# Patient Record
Sex: Female | Born: 1980
Health system: Southern US, Community
[De-identification: ages and names within clinical notes are randomized; demographics above are authoritative.]

## PROBLEM LIST (undated history)

## (undated) DIAGNOSIS — K529 Noninfective gastroenteritis and colitis, unspecified: Secondary | ICD-10-CM

## (undated) HISTORY — DX: Noninfective gastroenteritis and colitis, unspecified: K52.9

---

## 2014-08-29 ENCOUNTER — Emergency Department (HOSPITAL_COMMUNITY)
Admission: EM | Admit: 2014-08-29 | Discharge: 2014-08-29 | Disposition: A | Discharge status: Home / Self Care | Payer: Self-pay | Attending: Emergency Medicine | Admitting: Emergency Medicine

## 2014-08-29 ENCOUNTER — Encounter (HOSPITAL_COMMUNITY): Payer: Self-pay | Admitting: Family Medicine

## 2014-08-29 DIAGNOSIS — Z720 Tobacco use: Secondary | ICD-10-CM | POA: Insufficient documentation

## 2014-08-29 DIAGNOSIS — Y92410 Unspecified street and highway as the place of occurrence of the external cause: Secondary | ICD-10-CM | POA: Insufficient documentation

## 2014-08-29 DIAGNOSIS — S3992XA Unspecified injury of lower back, initial encounter: Secondary | ICD-10-CM | POA: Insufficient documentation

## 2014-08-29 DIAGNOSIS — Y9389 Activity, other specified: Secondary | ICD-10-CM | POA: Insufficient documentation

## 2014-08-29 DIAGNOSIS — S0990XA Unspecified injury of head, initial encounter: Secondary | ICD-10-CM | POA: Insufficient documentation

## 2014-08-29 DIAGNOSIS — R51 Headache: Secondary | ICD-10-CM

## 2014-08-29 DIAGNOSIS — R519 Headache, unspecified: Secondary | ICD-10-CM

## 2014-08-29 DIAGNOSIS — Y998 Other external cause status: Secondary | ICD-10-CM | POA: Insufficient documentation

## 2014-08-29 MED ORDER — DIAZEPAM 5 MG PO TABS
5.0000 mg | ORAL_TABLET | Freq: Once | ORAL | Status: AC
  Administered 2014-08-29: 5 mg via ORAL
  Filled 2014-08-29: qty 1

## 2014-08-29 MED ORDER — KETOROLAC TROMETHAMINE 60 MG/2ML IM SOLN
60.0000 mg | Freq: Once | INTRAMUSCULAR | Status: AC
  Administered 2014-08-29: 60 mg via INTRAMUSCULAR
  Filled 2014-08-29: qty 2

## 2014-08-29 MED ORDER — OXYCODONE-ACETAMINOPHEN 5-325 MG PO TABS
1.0000 | ORAL_TABLET | Freq: Once | ORAL | Status: AC
  Administered 2014-08-29: 1 via ORAL
  Filled 2014-08-29: qty 1

## 2014-08-29 NOTE — ED Notes (Signed)
Declined W/C at D/C and was escorted to lobby by RN. 
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
## 2014-08-29 NOTE — Discharge Instructions (Signed)
Return to the emergency room with worsening of symptoms, new symptoms or with symptoms that are concerning, especially severe worsening of headache, visual or speech changes, weakness in face, arms or legs. RICE: Rest, Ice (three cycles of 20 mins on, 20mins off at least twice a day), compression/brace, elevation. Heating pad works well for back pain. Ibuprofen 400mg  (2 tablets 200mg ) every 5-6 hours for 3-5 days and then as needed for pain. Follow up with PCP/wellness center if symptoms worsen or are persistent.

## 2014-12-30 ENCOUNTER — Encounter (HOSPITAL_COMMUNITY): Payer: Self-pay | Admitting: Emergency Medicine

## 2014-12-30 ENCOUNTER — Emergency Department (HOSPITAL_COMMUNITY): Payer: Self-pay

## 2014-12-30 ENCOUNTER — Emergency Department (HOSPITAL_COMMUNITY)
Admission: EM | Admit: 2014-12-30 | Discharge: 2014-12-30 | Disposition: A | Discharge status: Home / Self Care | Payer: Self-pay | Attending: Emergency Medicine | Admitting: Emergency Medicine

## 2014-12-30 ENCOUNTER — Encounter (HOSPITAL_COMMUNITY): Payer: Self-pay | Admitting: *Deleted

## 2014-12-30 ENCOUNTER — Emergency Department (INDEPENDENT_AMBULATORY_CARE_PROVIDER_SITE_OTHER)
Admission: EM | Admit: 2014-12-30 | Discharge: 2014-12-30 | Disposition: A | Discharge status: Nursing Home (Includes ICF) | Payer: Self-pay | Source: Home / Self Care

## 2014-12-30 DIAGNOSIS — Z720 Tobacco use: Secondary | ICD-10-CM | POA: Insufficient documentation

## 2014-12-30 DIAGNOSIS — R10813 Right lower quadrant abdominal tenderness: Secondary | ICD-10-CM

## 2014-12-30 DIAGNOSIS — R1011 Right upper quadrant pain: Secondary | ICD-10-CM

## 2014-12-30 DIAGNOSIS — R3915 Urgency of urination: Secondary | ICD-10-CM | POA: Insufficient documentation

## 2014-12-30 DIAGNOSIS — R10811 Right upper quadrant abdominal tenderness: Secondary | ICD-10-CM

## 2014-12-30 DIAGNOSIS — K529 Noninfective gastroenteritis and colitis, unspecified: Secondary | ICD-10-CM | POA: Insufficient documentation

## 2014-12-30 DIAGNOSIS — R112 Nausea with vomiting, unspecified: Secondary | ICD-10-CM

## 2014-12-30 DIAGNOSIS — Z3202 Encounter for pregnancy test, result negative: Secondary | ICD-10-CM | POA: Insufficient documentation

## 2014-12-30 LAB — COMPREHENSIVE METABOLIC PANEL
ALT: 26 U/L (ref 0–35)
AST: 27 U/L (ref 0–37)
Albumin: 3.9 g/dL (ref 3.5–5.2)
Alkaline Phosphatase: 38 U/L — ABNORMAL LOW (ref 39–117)
Anion gap: 11 (ref 5–15)
BILIRUBIN TOTAL: 0.5 mg/dL (ref 0.3–1.2)
BUN: 8 mg/dL (ref 6–23)
CHLORIDE: 103 mmol/L (ref 96–112)
CO2: 24 mmol/L (ref 19–32)
Calcium: 8.9 mg/dL (ref 8.4–10.5)
Creatinine, Ser: 0.79 mg/dL (ref 0.50–1.10)
GFR calc non Af Amer: >90 mL/min (ref >90)
Glucose, Bld: 89 mg/dL (ref 70–99)
Potassium: 4 mmol/L (ref 3.5–5.1)
SODIUM: 138 mmol/L (ref 135–145)
Total Protein: 7.1 g/dL (ref 6.0–8.3)

## 2014-12-30 LAB — CBC WITH DIFFERENTIAL/PLATELET
Basophils Absolute: 0 10*3/uL (ref 0.0–0.1)
Basophils Relative: 0 % (ref 0–1)
EOS ABS: 0.1 10*3/uL (ref 0.0–0.7)
Eosinophils Relative: 1 % (ref 0–5)
HCT: 35.9 % — ABNORMAL LOW (ref 36.0–46.0)
Hemoglobin: 11.9 g/dL — ABNORMAL LOW (ref 12.0–15.0)
LYMPHS PCT: 29 % (ref 12–46)
Lymphs Abs: 2.2 10*3/uL (ref 0.7–4.0)
MCH: 28.1 pg (ref 26.0–34.0)
MCHC: 33.1 g/dL (ref 30.0–36.0)
MCV: 84.9 fL (ref 78.0–100.0)
Monocytes Absolute: 0.7 10*3/uL (ref 0.1–1.0)
Monocytes Relative: 10 % (ref 3–12)
Neutro Abs: 4.6 10*3/uL (ref 1.7–7.7)
Neutrophils Relative %: 60 % (ref 43–77)
PLATELETS: 271 10*3/uL (ref 150–400)
RBC: 4.23 MIL/uL (ref 3.87–5.11)
RDW: 13.4 % (ref 11.5–15.5)
WBC: 7.6 10*3/uL (ref 4.0–10.5)

## 2014-12-30 LAB — URINALYSIS, ROUTINE W REFLEX MICROSCOPIC
Bilirubin Urine: NEGATIVE
GLUCOSE, UA: NEGATIVE mg/dL
HGB URINE DIPSTICK: NEGATIVE
Ketones, ur: NEGATIVE mg/dL
Leukocytes, UA: NEGATIVE
Nitrite: NEGATIVE
PH: 7.5 (ref 5.0–8.0)
Protein, ur: NEGATIVE mg/dL
SPECIFIC GRAVITY, URINE: 1.01 (ref 1.005–1.030)
UROBILINOGEN UA: 1 mg/dL (ref 0.0–1.0)

## 2014-12-30 LAB — LIPASE, BLOOD: Lipase: 16 U/L (ref 11–59)

## 2014-12-30 LAB — I-STAT TROPONIN, ED: TROPONIN I, POC: 0 ng/mL (ref 0.00–0.08)

## 2014-12-30 LAB — POCT PREGNANCY, URINE: Preg Test, Ur: NEGATIVE

## 2014-12-30 MED ORDER — GI COCKTAIL ~~LOC~~
30.0000 mL | Freq: Once | ORAL | Status: AC
  Administered 2014-12-30: 30 mL via ORAL
  Filled 2014-12-30: qty 30

## 2014-12-30 MED ORDER — FENTANYL CITRATE (PF) 100 MCG/2ML IJ SOLN
50.0000 ug | Freq: Once | INTRAMUSCULAR | Status: AC
  Administered 2014-12-30: 50 ug via INTRAVENOUS
  Filled 2014-12-30: qty 2

## 2014-12-30 MED ORDER — PANTOPRAZOLE SODIUM 40 MG IV SOLR
40.0000 mg | Freq: Once | INTRAVENOUS | Status: AC
  Administered 2014-12-30: 40 mg via INTRAVENOUS
  Filled 2014-12-30: qty 40

## 2014-12-30 MED ORDER — METRONIDAZOLE 500 MG PO TABS: 500.0000 mg | ORAL_TABLET | Freq: Three times a day (TID) | ORAL | Status: DC

## 2014-12-30 MED ORDER — IOHEXOL 300 MG/ML  SOLN
100.0000 mL | Freq: Once | INTRAMUSCULAR | Status: AC | PRN
  Administered 2014-12-30: 100 mL via INTRAVENOUS

## 2014-12-30 MED ORDER — CIPROFLOXACIN HCL 500 MG PO TABS
500.0000 mg | ORAL_TABLET | Freq: Two times a day (BID) | ORAL | Status: DC
Start: 2014-12-30 — End: 2015-01-19

## 2014-12-30 MED ORDER — ONDANSETRON 4 MG PO TBDP
ORAL_TABLET | ORAL | Status: AC
  Filled 2014-12-30: qty 2

## 2014-12-30 MED ORDER — ONDANSETRON HCL 8 MG PO TABS: 8.0000 mg | ORAL_TABLET | Freq: Three times a day (TID) | ORAL | Status: DC | PRN

## 2014-12-30 MED ORDER — IOHEXOL 300 MG/ML  SOLN
25.0000 mL | Freq: Once | INTRAMUSCULAR | Status: AC | PRN
  Administered 2014-12-30: 25 mL via ORAL

## 2014-12-30 MED ORDER — MORPHINE SULFATE 4 MG/ML IJ SOLN
4.0000 mg | Freq: Once | INTRAMUSCULAR | Status: AC
  Administered 2014-12-30: 4 mg via INTRAVENOUS
  Filled 2014-12-30: qty 1

## 2014-12-30 MED ORDER — SODIUM CHLORIDE 0.9 % IV BOLUS (SEPSIS)
1000.0000 mL | Freq: Once | INTRAVENOUS | Status: AC
  Administered 2014-12-30: 1000 mL via INTRAVENOUS

## 2014-12-30 MED ORDER — ONDANSETRON 4 MG PO TBDP
8.0000 mg | ORAL_TABLET | Freq: Once | ORAL | Status: AC
  Administered 2014-12-30: 8 mg via ORAL

## 2014-12-30 MED ORDER — HYDROCODONE-ACETAMINOPHEN 5-325 MG PO TABS: 1.0000 | ORAL_TABLET | Freq: Four times a day (QID) | ORAL | Status: DC | PRN

## 2014-12-30 NOTE — ED Provider Notes (Signed)
CSN: [id number]     Arrival date & time 12/30/14  1652 History   First MD Initiated Contact with Patient 12/30/14 1725     Chief Complaint  Patient presents with  . Abdominal Pain     (Consider location/radiation/quality/duration/timing/severity/associated sxs/prior Treatment) HPI Comments: Cathy Lester is a 34 y.o. female who presents to the ED sent here from [facility] with complaints of 3 months of intermittent right upper quadrant pain. She reports the pain is sharp, 10/10, intermittent, radiating into the right lower quadrant, worse with movement, unrelated to eating, and with no known alleviating factors given that she has not tried anything. Associated symptoms include nausea and vomiting, reporting that she has had 2 episodes of nonbloody nonbilious emesis in the last 24 hours consisting of stomach contents. Additionally she states she has had urinary urgency, but denies any dysuria or urinary frequency. She denies any fevers, chills, chest pain, shortness breath, diarrhea, constipation, obstipation, melena, hematochezia, hematemesis, dysuria, hematuria, urinary frequency, vaginal bleeding or discharge, numbness, tingling, weakness, rashes, arthralgias, myalgias, sick contacts, recent travel, antibiotic use, suspicious food intake, or alcohol use. She endorses to taking NSAIDs daily for chronic headaches. Last menstrual period was 3 days ago. She is sexually active with females only, no penetrative sexual activities.   Patient is a 34 y.o. female presenting with abdominal pain. The history is provided by the patient. No language interpreter was used.  Abdominal Pain Pain location:  RUQ Pain quality: sharp   Pain radiates to:  RLQ Pain severity:  Severe Onset quality:  Gradual Duration:  12 weeks Timing:  Intermittent Progression:  Worsening Chronicity:  New Context: not alcohol use, not eating, not recent illness, not recent sexual activity, not recent travel, not sick contacts and not  suspicious food intake   Relieved by:  None tried Worsened by:  Movement Ineffective treatments:  None tried Associated symptoms: nausea and vomiting   Associated symptoms: no chest pain, no chills, no constipation, no diarrhea, no dysuria, no fever, no flatus, no hematemesis, no hematochezia, no hematuria, no melena, no shortness of breath, no vaginal bleeding and no vaginal discharge   Risk factors: NSAID use   Risk factors: no alcohol abuse     History reviewed. No pertinent past medical history. History reviewed. No pertinent past surgical history. No family history on file. History  Substance Use Topics  . Smoking status: Current Every Day Smoker -- 0.50 packs/day    Types: Cigarettes  . Smokeless tobacco: Not on file  . Alcohol Use: No   OB History    No data available     Review of Systems  Constitutional: Negative for fever and chills.  Respiratory: Negative for shortness of breath.   Cardiovascular: Negative for chest pain.  Gastrointestinal: Positive for nausea, vomiting and abdominal pain. Negative for diarrhea, constipation, blood in stool, melena, hematochezia, flatus and hematemesis.  Genitourinary: Positive for urgency. Negative for dysuria, frequency, hematuria, flank pain, vaginal bleeding and vaginal discharge.  Musculoskeletal: Negative for myalgias and arthralgias.  Skin: Negative for rash.  Allergic/Immunologic: Negative for immunocompromised state.  Neurological: Negative for weakness and numbness.  Psychiatric/Behavioral: Negative for confusion.   10 Systems reviewed and are negative for acute change except as noted in the HPI.    Allergies  Review of patient's allergies indicates no known allergies.  Home Medications   Prior to Admission medications   Medication Sig Start Date End Date Taking? Authorizing Provider  acetaminophen (TYLENOL) 325 MG tablet Take 650 mg by mouth every  6 (six) hours as needed.   Yes Historical Provider, MD   BP 113/71  mmHg  Pulse 60  Temp(Src) 97.5 F (36.4 C) (Oral)  Resp 18  Ht  (1.702 m)  Wt 180 lb (81.647 kg)  BMI 28.19 kg/m2  SpO2 100%  LMP 12/22/2014 Physical Exam  Constitutional: She is oriented to person, place, and time. Vital signs are normal. She appears well-developed and well-nourished.  Non-toxic appearance. No distress.  Afebrile, nontoxic, NAD  HENT:  Head: Normocephalic and atraumatic.  Mouth/Throat: Oropharynx is clear and moist and mucous membranes are normal.  Eyes: Conjunctivae and EOM are normal. Right eye exhibits no discharge. Left eye exhibits no discharge.  Neck: Normal range of motion. Neck supple.  Cardiovascular: Normal rate, regular rhythm, normal heart sounds and intact distal pulses.  Exam reveals no gallop and no friction rub.   No murmur heard. Pulmonary/Chest: Effort normal and breath sounds normal. No respiratory distress. She has no decreased breath sounds. She has no wheezes. She has no rhonchi. She has no rales.  Abdominal: Soft. Normal appearance and bowel sounds are normal. She exhibits no distension. There is tenderness in the right upper quadrant and right lower quadrant. There is guarding (voluntary), tenderness at McBurney's point and positive Murphy's sign. There is no rigidity, no rebound and no CVA tenderness.    Soft, nondistended, +BS throughout, with RUQ TTP and RLQ TTP over Mcburney's point, slight guarding voluntarily, no rebound or rigidity, +murphy's, neg psoas sign, neg foot tap test, no CVA TTP   Musculoskeletal: Normal range of motion.  Neurological: She is alert and oriented to person, place, and time. She has normal strength. No sensory deficit.  Skin: Skin is warm, dry and intact. No rash noted.  Psychiatric: She has a normal mood and affect.  Nursing note and vitals reviewed.   ED Course  Procedures (including critical care time) Labs Review Labs Reviewed  CBC WITH DIFFERENTIAL/PLATELET - Abnormal; Notable for the following:     Hemoglobin 11.9 (*)    HCT 35.9 (*)    All other components within normal limits  COMPREHENSIVE METABOLIC PANEL - Abnormal; Notable for the following:    Alkaline Phosphatase 38 (*)    All other components within normal limits  LIPASE, BLOOD  URINALYSIS, ROUTINE W REFLEX MICROSCOPIC  I-STAT TROPOININ, ED  POC URINE PREG, ED    Imaging Review Ct Abdomen Pelvis W Contrast  12/30/2014   CLINICAL DATA:  Right lower and right upper quadrant tenderness for 2 weeks. Positive Murphy sign. Question appendicitis versus cholecystitis.  EXAM: CT ABDOMEN AND PELVIS WITH CONTRAST  TECHNIQUE: Multidetector CT imaging of the abdomen and pelvis was performed using the standard protocol following bolus administration of intravenous contrast.  CONTRAST:  OMNIPAQUE IOHEXOL 300 MG/ML  SOLN  COMPARISON:  None.  FINDINGS: The included lung bases are clear.  The gallbladder is physiologically distended without pericholecystic inflammatory change.  There is a tiny appendicolith in the mid appendix, however remains normal in thickness measuring 6 mm. There is no periappendiceal inflammatory change to suggest appendicitis.  There is colonic wall thickening involving the cecum and proximal ascending colon with faint surrounding pericolonic soft tissue stranding. The terminal ileum appears normal. The remainder the colon appears normal. No small bowel dilatation.  The liver, spleen, adrenal glands, pancreas, and kidneys are normal. Abdominal aorta is normal in caliber. No retroperitoneal adenopathy. No free air, free fluid, or intra-abdominal fluid collection.  Within the pelvis the urinary bladder is  physiologically distended. The uterus and adnexa are normal for age. Trace pelvic free fluid is physiologic. Minimal fat in both inguinal canals.  There are no acute or suspicious osseous abnormalities. Subcortical cystic change noted about both hips. There is a bone island in the left pubic ramus.  IMPRESSION: 1. Colonic wall  thickening with mild surrounding inflammatory change involving the cecum and ascending colon most consistent with colitis. This may be infectious or inflammatory. The terminal ileum appears normal. 2. Normal gallbladder. Tiny appendicolith in the mid appendix, however no appendicitis. No periappendiceal inflammatory change.   Electronically Signed   By: Rubye OaksMelanie  Ehinger M.D.   On: 12/30/2014 19:37     EKG Interpretation   Date/Time:  Thursday December 30 2014 17:51:10 EDT Ventricular Rate:  60 PR Interval:  127 QRS Duration: 80 QT Interval:  437 QTC Calculation: 437 R Axis:   73 Text Interpretation:  Sinus rhythm Confirmed by HARRISON  MD, FORREST  ([id number]) on 12/30/2014 5:56:25 PM      MDM   Final diagnoses:  RLQ abdominal tenderness  RUQ abdominal tenderness  Non-intractable vomiting with nausea, vomiting of unspecified type  Colitis    34 y.o. female sent here from [facility] for RUQ and RLQ pain, on exam pt is tender in RUQ with +murphy's but also has RLQ tenderness over mcburney's point. Will proceed with CT imaging (to r/o appendicitis and hopefully visualize gallbladder) and labs, will give antiemetics and pain meds. Doubt need for pelvic exam, pt is not engaging in penetrative sex with men (only has sexual encounters with females, no penetration). Will reassess shortly.   7:27 PM Pt requesting more pain meds, didn't like how morphine made her feel, will try fentanyl now. Will also give GI cocktail and protonix. So far, troponin neg, CBC w/diff unremarkable, CMP WNL, lipase WNL. EKG unremarkable. Awaiting CT reading and urinalysis. Tolerating PO here.   8:39 PM CT showing colonic wall thickening with mild surrounding inflammatory changes involving cecum and ascending colon c/w colitis, no appendicitis or cholecystitis. Still awaiting urinalysis. Pain returning, will give another dose of fentanyl.  10:05 PM Pain improved with fentanyl. Tolerating PO with no ongoing nausea. U/A clear. She  now endorses that over the last few months she's had nonbloody loose stool when she eats, approx 1x/day. Given this, along with the colitis on CT, will proceed with treatment of infectious sources, but will have her f/up with GI in order to have further testing to see if it could be inflammatory (crohn's could fit the clinical picture). Will send home with pain meds and nausea meds. Discussed staying hydrated. I explained the diagnosis and have given explicit precautions to return to the ER including for any other new or worsening symptoms. The patient understands and accepts the medical plan as it's been dictated and I have answered their questions. Discharge instructions concerning home care and prescriptions have been given. The patient is STABLE and is discharged to home in good condition.  BP 109/64 mmHg  Pulse 58  Temp(Src) 97.5 F (36.4 C) (Oral)  Resp 17  Ht 5\' 7"  (1.702 m)  Wt 180 lb (81.647 kg)  BMI 28.19 kg/m2  SpO2 100%  LMP 12/22/2014  Meds ordered this encounter  Medications  . ondansetron (ZOFRAN-ODT) disintegrating tablet 8 mg    Sig:   . sodium chloride 0.9 % bolus 1,000 mL    Sig:   . morphine 4 MG/ML injection 4 mg    Sig:   . iohexol (OMNIPAQUE) 300  MG/ML solution 25 mL    Sig:   . iohexol (OMNIPAQUE) 300 MG/ML solution 100 mL    Sig:   . fentaNYL (SUBLIMAZE) injection 50 mcg    Sig:   . gi cocktail (Maalox,Lidocaine,Donnatal)    Sig:   . pantoprazole (PROTONIX) injection 40 mg    Sig:   . fentaNYL (SUBLIMAZE) injection 50 mcg    Sig:   . HYDROcodone-acetaminophen (NORCO) 5-325 MG per tablet    Sig: Take 1 tablet by mouth every 6 (six) hours as needed for severe pain.    Dispense:  10 tablet    Refill:  0    Order Specific Question:  Supervising Provider    Answer:  Hyacinth Meeker, BRIAN [[id number]]  . ondansetron (ZOFRAN) 8 MG tablet    Sig: Take 1 tablet (8 mg total) by mouth every 8 (eight) hours as needed for nausea or vomiting.    Dispense:  10 tablet    Refill:   0    Order Specific Question:  Supervising Provider    Answer:  Hyacinth Meeker, BRIAN [[id number]]  . ciprofloxacin (CIPRO) 500 MG tablet    Sig: Take 1 tablet (500 mg total) by mouth 2 (two) times daily. One po bid x 7 days    Dispense:  14 tablet    Refill:  0    Order Specific Question:  Supervising Provider    Answer:  Hyacinth Meeker, BRIAN [[id number]]  . metroNIDAZOLE (FLAGYL) 500 MG tablet    Sig: Take 1 tablet (500 mg total) by mouth 3 (three) times daily. One po tid x 7 days    Dispense:  21 tablet    Refill:  0    Order Specific Question:  Supervising Provider    Answer:  Eber Hong [[id number]]     Coen Miyasato Camprubi-Soms, PA-C 12/30/14 2212  Purvis Sheffield, MD 12/31/14 [phone]

## 2014-12-30 NOTE — Discharge Instructions (Signed)
Your lab work was unremarkable but your CT scan showed colitis (inflammation of the colon). This could be infectious in nature therefore take cipro and flagyl as directed, and don't drink alcohol while taking these. stay well hydrated and use norco as needed for pain. Avoid NSAIDs (ibuprofen/aleve) when possible but if you need to take them then always have a full stomach when you use them. Use zofran as needed for nausea/vomiting. Stay well hydrated. Follow up with the GI doctor for further evaluation and treatment. Return to the ER for changes or worsening symptoms.   Abdominal (belly) pain can be caused by many things. Your caregiver performed an examination and possibly ordered blood/urine tests and imaging (CT scan, x-rays, ultrasound). Many cases can be observed and treated at home after initial evaluation in the emergency department. Even though you are being discharged home, abdominal pain can be unpredictable. Therefore, you need a repeated exam if your pain does not resolve, returns, or worsens. Most patients with abdominal pain don't have to be admitted to the hospital or have surgery, but serious problems like appendicitis and gallbladder attacks can start out as nonspecific pain. Many abdominal conditions cannot be diagnosed in one visit, so follow-up evaluations are very important. SEEK IMMEDIATE MEDICAL ATTENTION IF YOU DEVELOP ANY OF THE FOLLOWING SYMPTOMS:  The pain does not go away or becomes severe.   A temperature above 101 develops.   Repeated vomiting occurs (multiple episodes).   The pain becomes localized to portions of the abdomen. The right side could possibly be appendicitis. In an adult, the left lower portion of the abdomen could be colitis or diverticulitis.   Blood is being passed in stools or vomit (bright red or black tarry stools).   Return also if you develop chest pain, difficulty breathing, dizziness or fainting, or become confused, poorly responsive, or  inconsolable (young children).  The constipation stays for more than 4 days.   There is belly (abdominal) or rectal pain.   You do not seem to be getting better.     Abdominal Pain, Women Abdominal (stomach, pelvic, or belly) pain can be caused by many things. It is important to tell your doctor:  The location of the pain.  Does it come and go or is it present all the time?  Are there things that start the pain (eating certain foods, exercise)?  Are there other symptoms associated with the pain (fever, nausea, vomiting, diarrhea)? All of this is helpful to know when trying to find the cause of the pain. CAUSES   Stomach: virus or bacteria infection, or ulcer.  Intestine: appendicitis (inflamed appendix), regional ileitis (Crohn's disease), ulcerative colitis (inflamed colon), irritable bowel syndrome, diverticulitis (inflamed diverticulum of the colon), or cancer of the stomach or intestine.  Gallbladder disease or stones in the gallbladder.  Kidney disease, kidney stones, or infection.  Pancreas infection or cancer.  Fibromyalgia (pain disorder).  Diseases of the female organs:  Uterus: fibroid (non-cancerous) tumors or infection.  Fallopian tubes: infection or tubal pregnancy.  Ovary: cysts or tumors.  Pelvic adhesions (scar tissue).  Endometriosis (uterus lining tissue growing in the pelvis and on the pelvic organs).  Pelvic congestion syndrome (female organs filling up with blood just before the menstrual period).  Pain with the menstrual period.  Pain with ovulation (producing an egg).  Pain with an IUD (intrauterine device, birth control) in the uterus.  Cancer of the female organs.  Functional pain (pain not caused by a disease, may improve without treatment).  Psychological pain.  Depression. DIAGNOSIS  Your doctor will decide the seriousness of your pain by doing an examination.  Blood tests.  X-rays.  Ultrasound.  CT scan (computed  tomography, special type of X-ray).  MRI (magnetic resonance imaging).  Cultures, for infection.  Barium enema (dye inserted in the large intestine, to better view it with X-rays).  Colonoscopy (looking in intestine with a lighted tube).  Laparoscopy (minor surgery, looking in abdomen with a lighted tube).  Major abdominal exploratory surgery (looking in abdomen with a large incision). TREATMENT  The treatment will depend on the cause of the pain.   Many cases can be observed and treated at home.  Over-the-counter medicines recommended by your caregiver.  Prescription medicine.  Antibiotics, for infection.  Birth control pills, for painful periods or for ovulation pain.  Hormone treatment, for endometriosis.  Nerve blocking injections.  Physical therapy.  Antidepressants.  Counseling with a psychologist or psychiatrist.  Minor or major surgery. HOME CARE INSTRUCTIONS   Do not take laxatives, unless directed by your caregiver.  Take over-the-counter pain medicine only if ordered by your caregiver. Do not take aspirin because it can cause an upset stomach or bleeding.  Try a clear liquid diet (broth or water) as ordered by your caregiver. Slowly move to a bland diet, as tolerated, if the pain is related to the stomach or intestine.  Have a thermometer and take your temperature several times a day, and record it.  Bed rest and sleep, if it helps the pain.  Avoid sexual intercourse, if it causes pain.  Avoid stressful situations.  Keep your follow-up appointments and tests, as your caregiver orders.  If the pain does not go away with medicine or surgery, you may try:  Acupuncture.  Relaxation exercises (yoga, meditation).  Group therapy.  Counseling. SEEK MEDICAL CARE IF:   You notice certain foods cause stomach pain.  Your home care treatment is not helping your pain.  You need stronger pain medicine.  You want your IUD removed.  You feel faint  or lightheaded.  You develop nausea and vomiting.  You develop a rash.  You are having side effects or an allergy to your medicine. SEEK IMMEDIATE MEDICAL CARE IF:   Your pain does not go away or gets worse.  You have a fever.  Your pain is felt only in portions of the abdomen. The right side could possibly be appendicitis. The left lower portion of the abdomen could be colitis or diverticulitis.  You are passing blood in your stools (bright red or black tarry stools, with or without vomiting).  You have blood in your urine.  You develop chills, with or without a fever.  You pass out. MAKE SURE YOU:   Understand these instructions.  Will watch your condition.  Will get help right away if you are not doing well or get worse. Document Released: 07/01/2007 Document Revised: 01/18/2014 Document Reviewed: 07/21/2009 [facility] Patient Information 2015 [place], [place]. This information is not intended to replace advice given to you by your health care provider. Make sure you discuss any questions you have with your health care provider.  Nausea and Vomiting Nausea is a sick feeling that often comes before throwing up (vomiting). Vomiting is a reflex where stomach contents come out of your mouth. Vomiting can cause severe loss of body fluids (dehydration). Children and elderly adults can become dehydrated quickly, especially if they also have diarrhea. Nausea and vomiting are symptoms of a condition or disease. It is  important to find the cause of your symptoms. CAUSES   Direct irritation of the stomach lining. This irritation can result from increased acid production (gastroesophageal reflux disease), infection, food poisoning, taking certain medicines (such as nonsteroidal anti-inflammatory drugs), alcohol use, or tobacco use.  Signals from the brain.These signals could be caused by a headache, heat exposure, an inner ear disturbance, increased pressure in the brain from injury,  infection, a tumor, or a concussion, pain, emotional stimulus, or metabolic problems.  An obstruction in the gastrointestinal tract (bowel obstruction).  Illnesses such as diabetes, hepatitis, gallbladder problems, appendicitis, kidney problems, cancer, sepsis, atypical symptoms of a heart attack, or eating disorders.  Medical treatments such as chemotherapy and radiation.  Receiving medicine that makes you sleep (general anesthetic) during surgery. DIAGNOSIS Your caregiver may ask for tests to be done if the problems do not improve after a few days. Tests may also be done if symptoms are severe or if the reason for the nausea and vomiting is not clear. Tests may include:  Urine tests.  Blood tests.  Stool tests.  Cultures (to look for evidence of infection).  X-rays or other imaging studies. Test results can help your caregiver make decisions about treatment or the need for additional tests. TREATMENT You need to stay well hydrated. Drink frequently but in small amounts.You may wish to drink water, sports drinks, clear broth, or eat frozen ice pops or gelatin dessert to help stay hydrated.When you eat, eating slowly may help prevent nausea.There are also some antinausea medicines that may help prevent nausea. HOME CARE INSTRUCTIONS   Take all medicine as directed by your caregiver.  If you do not have an appetite, do not force yourself to eat. However, you must continue to drink fluids.  If you have an appetite, eat a normal diet unless your caregiver tells you differently.  Eat a variety of complex carbohydrates (rice, wheat, potatoes, bread), lean meats, yogurt, fruits, and vegetables.  Avoid high-fat foods because they are more difficult to digest.  Drink enough water and fluids to keep your urine clear or pale yellow.  If you are dehydrated, ask your caregiver for specific rehydration instructions. Signs of dehydration may include:  Severe thirst.  Dry lips and  mouth.  Dizziness.  Dark urine.  Decreasing urine frequency and amount.  Confusion.  Rapid breathing or pulse. SEEK IMMEDIATE MEDICAL CARE IF:   You have blood or brown flecks (like coffee grounds) in your vomit.  You have black or bloody stools.  You have a severe headache or stiff neck.  You are confused.  You have severe abdominal pain.  You have chest pain or trouble breathing.  You do not urinate at least once every 8 hours.  You develop cold or clammy skin.  You continue to vomit for longer than 24 to 48 hours.  You have a fever. MAKE SURE YOU:   Understand these instructions.  Will watch your condition.  Will get help right away if you are not doing well or get worse. Document Released: 09/03/2005 Document Revised: 11/26/2011 Document Reviewed: 01/31/2011 [facility] Patient Information 2015 [place], [place]. This information is not intended to replace advice given to you by your health care provider. Make sure you discuss any questions you have with your health care provider.  Colitis Colitis is inflammation of the colon. Colitis can be a short-term or long-standing (chronic) illness. Crohn's disease and ulcerative colitis are 2 types of colitis which are chronic. They usually require lifelong treatment. CAUSES  There are many different causes of colitis, including:  Viruses.  Germs (bacteria).  Medicine reactions. SYMPTOMS   Diarrhea.  Intestinal bleeding.  Pain.  Fever.  Throwing up (vomiting).  Tiredness (fatigue).  Weight loss.  Bowel blockage. DIAGNOSIS  The diagnosis of colitis is based on examination and stool or blood tests. X-rays, CT scan, and colonoscopy may also be needed. TREATMENT  Treatment may include:  Fluids given through the vein (intravenously).  Bowel rest (nothing to eat or drink for a period of time).  Medicine for pain and diarrhea.  Medicines (antibiotics) that kill germs.  Cortisone  medicines.  Surgery. HOME CARE INSTRUCTIONS   Get plenty of rest.  Drink enough water and fluids to keep your urine clear or pale yellow.  Eat a well-balanced diet.  Call your caregiver for follow-up as recommended. SEEK IMMEDIATE MEDICAL CARE IF:   You develop chills.  You have an oral temperature above 102 F (38.9 C), not controlled by medicine.  You have extreme weakness, fainting, or dehydration.  You have repeated vomiting.  You develop severe belly (abdominal) pain or are passing bloody or tarry stools. MAKE SURE YOU:   Understand these instructions.  Will watch your condition.  Will get help right away if you are not doing well or get worse. Document Released: 10/11/2004 Document Revised: 11/26/2011 Document Reviewed: 01/06/2010 [facility] Patient Information 2015 [place], [place]. This information is not intended to replace advice given to you by your health care provider. Make sure you discuss any questions you have with your health care provider.

## 2014-12-30 NOTE — ED Provider Notes (Signed)
Cathy Lester is a 34 y.o. female who presents to Urgent Care today for abdominal pain. Patient has a two-week history of right-sided abdominal pain. This worsened yesterday evening and is now severe. The pain does not seem to be related to food. She denies any fevers or chills but does note a mild headache and sore throat. She does note some urinary urgency but denies any frequency or dysuria. No vomiting. No treatment tried yet.   History reviewed. No pertinent past medical history. History reviewed. No pertinent past surgical history. History  Substance Use Topics  . Smoking status: Current Every Day Smoker  . Smokeless tobacco: Not on file  . Alcohol Use: No   ROS as above Medications: No current facility-administered medications for this encounter.   No current outpatient prescriptions on file.   No Known Allergies   Exam:  BP 117/89 mmHg  Pulse 77  Temp(Src) 97.9 F (36.6 C) (Oral)  Resp 16  SpO2 100% Gen: In pain appearing HEENT: EOMI,  MMM Lungs: Normal work of breathing. CTABL Heart: RRR no MRG Abd: NABS, . Nondistended, tender palpation right lower and upper quadrants. Patient has referred pain to the right lower and upper quadrants with palpation of the left side of her abdomen. She also has rebound tenderness with some guarding. She has a positive Murphy sign in the right upper quadrant. Exts: Brisk capillary refill, warm and well perfused.   Results for orders placed or performed during the hospital encounter of 12/30/14 (from the past 24 hour(s))  Pregnancy, urine POC     Status: None   Collection Time: 12/30/14  4:15 PM  Result Value Ref Range   Preg Test, Ur NEGATIVE NEGATIVE   No results found.  Assessment and Plan: 34 y.o. female with abdominal pain. This is concerning for cholecystitis versus appendicitis. Transfer to ED for evaluation and management.  Discussed warning signs or symptoms. Please see discharge instructions. Patient expresses  understanding.     Rodolph BongEvan S Telitha Plath, MD 12/30/14 [phone]

## 2014-12-30 NOTE — ED Notes (Signed)
Patient has right side abdominal pain, sharp and grabbing and also has a headache.  Abdominal pain for 2 weeks.  Last bm was yesterday, normal.  Denies vaginal discharge, no burning with urination

## 2014-12-30 NOTE — ED Notes (Signed)
Pt c/o abdominal pain x 1 month with N/V/D.  Pt was seen at Urgent Care and sent to the ED.

## 2015-01-19 ENCOUNTER — Inpatient Hospital Stay (HOSPITAL_COMMUNITY)
Admission: AD | Admit: 2015-01-19 | Discharge: 2015-01-19 | Disposition: A | Discharge status: Home / Self Care | Payer: 59 | Source: Ambulatory Visit | Attending: Obstetrics and Gynecology | Admitting: Obstetrics and Gynecology

## 2015-01-19 ENCOUNTER — Encounter (HOSPITAL_COMMUNITY): Payer: Self-pay

## 2015-01-19 DIAGNOSIS — R109 Unspecified abdominal pain: Secondary | ICD-10-CM | POA: Diagnosis not present

## 2015-01-19 DIAGNOSIS — Z87891 Personal history of nicotine dependence: Secondary | ICD-10-CM | POA: Diagnosis not present

## 2015-01-19 DIAGNOSIS — R198 Other specified symptoms and signs involving the digestive system and abdomen: Secondary | ICD-10-CM

## 2015-01-19 DIAGNOSIS — R1011 Right upper quadrant pain: Secondary | ICD-10-CM | POA: Diagnosis present

## 2015-01-19 DIAGNOSIS — K529 Noninfective gastroenteritis and colitis, unspecified: Secondary | ICD-10-CM

## 2015-01-19 LAB — CBC
HCT: 35.7 % — ABNORMAL LOW (ref 36.0–46.0)
Hemoglobin: 12.1 g/dL (ref 12.0–15.0)
MCH: 28.7 pg (ref 26.0–34.0)
MCHC: 33.9 g/dL (ref 30.0–36.0)
MCV: 84.6 fL (ref 78.0–100.0)
PLATELETS: 239 10*3/uL (ref 150–400)
RBC: 4.22 MIL/uL (ref 3.87–5.11)
RDW: 13.9 % (ref 11.5–15.5)
WBC: 8.3 10*3/uL (ref 4.0–10.5)

## 2015-01-19 LAB — URINALYSIS, ROUTINE W REFLEX MICROSCOPIC
Bilirubin Urine: NEGATIVE
Glucose, UA: NEGATIVE mg/dL
KETONES UR: NEGATIVE mg/dL
Nitrite: NEGATIVE
PH: 5.5 (ref 5.0–8.0)
PROTEIN: NEGATIVE mg/dL
Specific Gravity, Urine: 1.02 (ref 1.005–1.030)
Urobilinogen, UA: 0.2 mg/dL (ref 0.0–1.0)

## 2015-01-19 LAB — URINE MICROSCOPIC-ADD ON

## 2015-01-19 LAB — POCT PREGNANCY, URINE: Preg Test, Ur: NEGATIVE

## 2015-01-19 LAB — LIPASE, BLOOD: LIPASE: 20 U/L — AB (ref 22–51)

## 2015-01-19 LAB — AMYLASE: Amylase: 60 U/L (ref 28–100)

## 2015-01-19 MED ORDER — PANTOPRAZOLE SODIUM 20 MG PO TBEC: 20.0000 mg | DELAYED_RELEASE_TABLET | Freq: Every day | ORAL | Status: DC

## 2015-01-19 NOTE — Discharge Instructions (Signed)

## 2015-01-19 NOTE — MAU Provider Note (Signed)
Chief Complaint: No chief complaint on file.   None     SUBJECTIVE HPI: Cathy Lester is a 34 y.o. who presents to maternity admissions reporting RUQ and right mid abdominal pain x several months, with worsening pain while she was at work today. She reports recent history of constipation alternating with diarrhea, with mostly diarrhea last 2-3 weeks.  The pain is worse after eating.  [facility] 12/30/14 for same symptoms and was referred to GI. She saw GI x 1 visit but cannot afford up front payments needed for colonoscopy or further labs.  She reports some intermittent lower abdominal cramping yesterday consistent with menstrual cramping and she anticipates her period any day now. She denies lower abdominal pain today and does not desire Gyn evaluation or STD testing today.  She does not have a primary care provider.  She denies vaginal bleeding, vaginal itching/burning, urinary symptoms, h/a, dizziness, n/v, or fever/chills.     Abdominal Pain This is a recurrent problem. The current episode started more than 1 month ago. The problem occurs intermittently. The most recent episode lasted 1 day. The problem has been rapidly worsening. The pain is located in the RUQ and periumbilical region. The pain is severe. The quality of the pain is aching, colicky and sharp. The abdominal pain does not radiate. Associated symptoms include constipation and diarrhea. Pertinent negatives include no dysuria, fever, frequency, headaches, nausea or vomiting. The pain is aggravated by certain positions, bowel movement and eating. The pain is relieved by nothing. She has tried acetaminophen for the symptoms. The treatment provided no relief. Prior diagnostic workup includes CT scan and GI consult.    History reviewed. No pertinent past medical history. History reviewed. No pertinent past surgical history. History   Social History  . Marital Status: Single    Spouse Name: N/A  . Number of Children: N/A   . Years of Education: N/A   Occupational History  . Not on file.   Social History Main Topics  . Smoking status: Former Smoker -- 0.50 packs/day    Types: Cigarettes  . Smokeless tobacco: Not on file  . Alcohol Use: No  . Drug Use: No  . Sexual Activity: Yes   Other Topics Concern  . Not on file   Social History Narrative   No current facility-administered medications on file prior to encounter.   Current Outpatient Prescriptions on File Prior to Encounter  Medication Sig Dispense Refill  . acetaminophen (TYLENOL) 325 MG tablet Take 650 mg by mouth every 6 (six) hours as needed.    . ciprofloxacin (CIPRO) 500 MG tablet Take 1 tablet (500 mg total) by mouth 2 (two) times daily. One po bid x 7 days 14 tablet 0  . HYDROcodone-acetaminophen (NORCO) 5-325 MG per tablet Take 1 tablet by mouth every 6 (six) hours as needed for severe pain. 10 tablet 0  . metroNIDAZOLE (FLAGYL) 500 MG tablet Take 1 tablet (500 mg total) by mouth 3 (three) times daily. One po tid x 7 days 21 tablet 0  . ondansetron (ZOFRAN) 8 MG tablet Take 1 tablet (8 mg total) by mouth every 8 (eight) hours as needed for nausea or vomiting. 10 tablet 0   Allergies  Allergen Reactions  . Morphine And Related Nausea And Vomiting    Review of Systems  Constitutional: Negative for fever, chills and malaise/fatigue.  Eyes: Negative for blurred vision.  Respiratory: Negative for cough and shortness of breath.   Cardiovascular: Negative for  chest pain.  Gastrointestinal: Positive for abdominal pain, diarrhea and constipation. Negative for heartburn, nausea and vomiting.  Genitourinary: Negative for dysuria, urgency and frequency.  Musculoskeletal: Negative.   Neurological: Negative for dizziness and headaches.  Psychiatric/Behavioral: Negative for depression.    OBJECTIVE Blood pressure 130/84, pulse 84, temperature 98.5 F (36.9 C), temperature source Oral, resp. rate 16, height 5\' 7"  (1.702 m), weight 90.266 kg  (199 lb), last menstrual period 12/22/2014, SpO2 100 %. GENERAL: Well-developed, well-nourished female in no acute distress.  EYES: normal sclera/conjunctiva; no lid-lag HENT: Atraumatic, normocephalic HEART: normal rate RESP: normal effort GI: Soft, mild tenderness in RUQ and right mid abdomen, no RLQ pain, no rebound tenderness, no guarding, no palpable mass MUSCULOSKELETAL: Normal ROM EXTREMITIES: Nontender, no edema NEURO/PSYCH: Alert and oriented, appropriate affect  GU: Pt declined pelvic exam    LAB RESULTS Results for orders placed or performed during the hospital encounter of 01/19/15 (from the past 24 hour(s))  Pregnancy, urine POC     Status: None   Collection Time: 01/19/15  8:28 PM  Result Value Ref Range   Preg Test, Ur NEGATIVE NEGATIVE    IMAGING Ct Abdomen Pelvis W Contrast  12/30/2014   CLINICAL DATA:  Right lower and right upper quadrant tenderness for 2 weeks. Positive Murphy sign. Question appendicitis versus cholecystitis.  EXAM: CT ABDOMEN AND PELVIS WITH CONTRAST  TECHNIQUE: Multidetector CT imaging of the abdomen and pelvis was performed using the standard protocol following bolus administration of intravenous contrast.  CONTRAST:  100mL OMNIPAQUE IOHEXOL 300 MG/ML  SOLN  COMPARISON:  None.  FINDINGS: The included lung bases are clear.  The gallbladder is physiologically distended without pericholecystic inflammatory change.  There is a tiny appendicolith in the mid appendix, however remains normal in thickness measuring 6 mm. There is no periappendiceal inflammatory change to suggest appendicitis.  There is colonic wall thickening involving the cecum and proximal ascending colon with faint surrounding pericolonic soft tissue stranding. The terminal ileum appears normal. The remainder the colon appears normal. No small bowel dilatation.  The liver, spleen, adrenal glands, pancreas, and kidneys are normal. Abdominal aorta is normal in caliber. No retroperitoneal  adenopathy. No free air, free fluid, or intra-abdominal fluid collection.  Within the pelvis the urinary bladder is physiologically distended. The uterus and adnexa are normal for age. Trace pelvic free fluid is physiologic. Minimal fat in both inguinal canals.  There are no acute or suspicious osseous abnormalities. Subcortical cystic change noted about both hips. There is a bone island in the left pubic ramus.  IMPRESSION: 1. Colonic wall thickening with mild surrounding inflammatory change involving the cecum and ascending colon most consistent with colitis. This may be infectious or inflammatory. The terminal ileum appears normal. 2. Normal gallbladder. Tiny appendicolith in the mid appendix, however no appendicitis. No periappendiceal inflammatory change.   Electronically Signed   By: Rubye OaksMelanie  Ehinger M.D.   On: 12/30/2014 19:37   MAU MDM CBC, amylase, lipase ordered.  Results reviewed.  Previous CT images and report reviewed.   A:   P: D/C home Pt given information on finding a PCP. F/U as soon as possible on chronic pain.  Teaching done about Crohn's disease and colitis, dietary changes recommended Renewed Rx for Protonix daily Return to ED as needed for emergencies    Medication List    ASK your doctor about these medications        acetaminophen 325 MG tablet  Commonly known as:  TYLENOL  Take 650 mg  by mouth every 6 (six) hours as needed.     ciprofloxacin 500 MG tablet  Commonly known as:  CIPRO  Take 1 tablet (500 mg total) by mouth 2 (two) times daily. One po bid x 7 days     HYDROcodone-acetaminophen 5-325 MG per tablet  Commonly known as:  NORCO  Take 1 tablet by mouth every 6 (six) hours as needed for severe pain.     metroNIDAZOLE 500 MG tablet  Commonly known as:  FLAGYL  Take 1 tablet (500 mg total) by mouth 3 (three) times daily. One po tid x 7 days     ondansetron 8 MG tablet  Commonly known as:  ZOFRAN  Take 1 tablet (8 mg total) by mouth every 8 (eight)  hours as needed for nausea or vomiting.         Sharen Counter Certified Nurse-Midwife 01/19/2015  9:13 PM

## 2015-01-19 NOTE — MAU Note (Signed)
Pt reports abd pain x 2 months, was seen at [facility] ED and was told it may be an infection. Pt states she was referred to a GYN, was seen there x one but could not afford the copay. States the pain is worsening.

## 2015-01-20 LAB — HIV ANTIBODY (ROUTINE TESTING W REFLEX): HIV Screen 4th Generation wRfx: NONREACTIVE

## 2015-02-10 ENCOUNTER — Encounter: Payer: Self-pay | Admitting: *Deleted

## 2015-03-16 ENCOUNTER — Ambulatory Visit: Payer: PRIVATE HEALTH INSURANCE | Admitting: Internal Medicine

## 2015-04-10 ENCOUNTER — Encounter (HOSPITAL_COMMUNITY): Payer: Self-pay | Admitting: *Deleted

## 2015-04-10 ENCOUNTER — Emergency Department (HOSPITAL_COMMUNITY)
Admission: EM | Admit: 2015-04-10 | Discharge: 2015-04-10 | Disposition: A | Discharge status: Home / Self Care | Payer: Commercial Managed Care - HMO | Attending: Emergency Medicine | Admitting: Emergency Medicine

## 2015-04-10 DIAGNOSIS — Z720 Tobacco use: Secondary | ICD-10-CM | POA: Insufficient documentation

## 2015-04-10 DIAGNOSIS — K088 Other specified disorders of teeth and supporting structures: Secondary | ICD-10-CM | POA: Diagnosis present

## 2015-04-10 DIAGNOSIS — R51 Headache: Secondary | ICD-10-CM | POA: Diagnosis not present

## 2015-04-10 DIAGNOSIS — K029 Dental caries, unspecified: Secondary | ICD-10-CM | POA: Insufficient documentation

## 2015-04-10 DIAGNOSIS — Z79899 Other long term (current) drug therapy: Secondary | ICD-10-CM | POA: Insufficient documentation

## 2015-04-10 DIAGNOSIS — K0889 Other specified disorders of teeth and supporting structures: Secondary | ICD-10-CM

## 2015-04-10 MED ORDER — BUPIVACAINE-EPINEPHRINE (PF) 0.5% -1:200000 IJ SOLN
1.8000 mL | Freq: Once | INTRAMUSCULAR | Status: AC
  Administered 2015-04-10: 1.8 mL
  Filled 2015-04-10: qty 1.8

## 2015-04-10 MED ORDER — TRAMADOL HCL 50 MG PO TABS
50.0000 mg | ORAL_TABLET | Freq: Once | ORAL | Status: AC
  Administered 2015-04-10: 50 mg via ORAL
  Filled 2015-04-10: qty 1

## 2015-04-10 MED ORDER — AMOXICILLIN 500 MG PO CAPS: 500.0000 mg | ORAL_CAPSULE | Freq: Three times a day (TID) | ORAL | Status: DC

## 2015-04-10 MED ORDER — TRAMADOL HCL 50 MG PO TABS: 50.0000 mg | ORAL_TABLET | Freq: Four times a day (QID) | ORAL | Status: DC | PRN

## 2015-04-10 NOTE — ED Provider Notes (Signed)
CSN: [id number]     Arrival date & time 04/10/15  1619 History  This chart was scribed for non-physician practitioner, Marlon Pel, PA-C, working with Purvis Sheffield, MD, by Ronney Lion, ED Scribe. This patient was seen in room WTR6/WTR6 and the patient's care was started at 4:46 PM.    Chief Complaint  Patient presents with  . Dental Pain  . Headache   The history is provided by the patient. No language interpreter was used.    HPI Comments: Cathy Lester is a 34 y.o. female who presents to the Emergency Department complaining of constant, severe, worsening left upper dental pain radiating to her left neck and ear that began 2 weeks ago and acutely worsened last night. She states she felt a tooth break when the pain first onset 2 weeks ago. Patient complains of an associated headache due to her dental pain that began today. She tried Orajel and Tylenol, with only partial relief. Patient states she has not been able to see a dentist for this due to insurance issues that have since resolved; she states she can contact her dentist tomorrow. Patient has NKDA to antibiotics.  Past Medical History  Diagnosis Date  . Colitis    History reviewed. No pertinent past surgical history. No family history on file. History  Substance Use Topics  . Smoking status: Current Some Day Smoker -- 0.50 packs/day    Types: Cigarettes  . Smokeless tobacco: Not on file  . Alcohol Use: No   OB History    No data available     Review of Systems  Neurological: Positive for headaches.  A complete 10 system review of systems was obtained and all systems are negative except as noted in the HPI and PMH.     Allergies  Morphine and related  Home Medications   Prior to Admission medications   Medication Sig Start Date End Date Taking? Authorizing Provider  acetaminophen (TYLENOL) 325 MG tablet Take 650 mg by mouth every 6 (six) hours as needed for mild pain or headache.     Historical Provider, MD   amoxicillin (AMOXIL) 500 MG capsule Take 1 capsule (500 mg total) by mouth 3 (three) times daily. 04/10/15   Zyla Dascenzo Neva Seat, PA-C  pantoprazole (PROTONIX) 20 MG tablet Take 1 tablet (20 mg total) by mouth daily. 01/19/15   Wilmer Floor Leftwich-Kirby, CNM  traMADol (ULTRAM) 50 MG tablet Take 1 tablet (50 mg total) by mouth every 6 (six) hours as needed. 04/10/15   Maleki Hippe Neva Seat, PA-C   BP 128/71 mmHg  Pulse 95  Temp(Src) 98 F (36.7 C) (Oral)  Resp 14  SpO2 97%  LMP 04/06/2015 Physical Exam  Constitutional: She is oriented to person, place, and time. She appears well-developed and well-nourished. No distress.  HENT:  Head: Normocephalic and atraumatic.  Mouth/Throat: Uvula is midline, oropharynx is clear and moist and mucous membranes are normal. No trismus in the jaw. Normal dentition. Dental caries (Pts tooth shows no obvious abscess but moderate to severe tenderness to palpation of marked tooth) present. No dental abscesses, uvula swelling or lacerations.    Eyes: Conjunctivae and EOM are normal. Pupils are equal, round, and reactive to light.  Neck: Trachea normal, normal range of motion and full passive range of motion without pain. Neck supple. No tracheal deviation present.  Cardiovascular: Normal rate, regular rhythm, normal heart sounds and normal pulses.   Pulmonary/Chest: Effort normal and breath sounds normal. No respiratory distress. Chest wall is not dull to percussion. She  exhibits no tenderness, no crepitus, no edema, no deformity and no retraction.  Abdominal: Normal appearance.  Musculoskeletal: Normal range of motion.  Neurological: She is alert and oriented to person, place, and time. She has normal strength.  Skin: Skin is warm, dry and intact. She is not diaphoretic.  Psychiatric: She has a normal mood and affect. Her speech is normal and behavior is normal. Cognition and memory are normal.  Nursing note and vitals reviewed.   ED Course  Procedures (including critical  care time)  DIAGNOSTIC STUDIES: Oxygen Saturation is 97% on RA, normal by my interpretation.    COORDINATION OF CARE: 4:33 PM - Pt verbally agreed to dental block procedure for immediate pain relief.   4:50 PM - Dental block performed by me. Pt reports moderate relief from the dental pain. Discussed treatment plan with pt, which includes Rx antibiotics and pain medications (Ultram and Amoxil). Advised pt to f/u by calling her dentist tomorrow morning. Pt verbalized understand and agreed to plan.    MDM   Final diagnoses:  Toothache   NERVE BLOCK Date/Time: 04/11/2015 Performed by: Dorthula Matas Authorized by: Dorthula Matas Consent: Verbal consent obtained. Risks and benefits: risks, benefits and alternatives were discussed Consent given by: patient Indications: pain relief Body area: face/mouth Laterality: left Needle gauge: 25 G Local anesthetic: lidocaine 2% without epinephrine Anesthetic total: 2 ml Outcome: pain improved Patient tolerance: Patient tolerated the procedure well with no immediate complications. Comments: Patient had complete relief of pain.   No emergent s/sx's present. Patent airway. No trismus.  No neck tenderness or protrusion of tongue or floor of mouth.  amoxicillin (AMOXIL) 500 MG capsule Take 1 capsule (500 mg total) by mouth 3 (three) times daily. 21 capsule Marlon Pel, PA-C   traMADol (ULTRAM) 50 MG tablet Take 1 tablet (50 mg total) by mouth every 6 (six) hours as needed. 15 tablet Marlon Pel, PA-C   Medications  bupivacaine-epinephrine (MARCAINE W/ EPI) 0.5% -1:200000 injection 1.8 mL (1.8 mLs Infiltration Given 04/10/15 1658)  traMADol (ULTRAM) tablet 50 mg (50 mg Oral Given 04/10/15 1658)    33 y.o.Cathy Lester's evaluation in the Emergency Department is complete. It has been determined that no acute conditions requiring further emergency intervention are present at this time. The patient/guardian have been advised of the  diagnosis and plan. We have discussed signs and symptoms that warrant return to the ED, such as changes or worsening in symptoms.  Vital signs are stable at discharge. Filed Vitals:   04/10/15 1625  BP: 128/71  Pulse: 95  Temp: 98 F (36.7 C)  Resp: 14    Patient/guardian has voiced understanding and agreed to follow-up with the PCP or specialist.   I personally performed the services described in this documentation, which was scribed in my presence. The recorded information has been reviewed and is accurate.   Marlon Pel, PA-C 04/11/15 1433  Marlon Pel, PA-C 04/11/15 1433  Purvis Sheffield, MD 04/12/15 1610

## 2015-04-10 NOTE — ED Notes (Signed)
Pt c/o back left upper dental pain for a few days. Worse since yesterday, now causing HA. Believes source could be a cracked tooth.

## 2015-04-10 NOTE — Discharge Instructions (Signed)
Dental Pain °A tooth ache may be caused by cavities (tooth decay). Cavities expose the nerve of the tooth to air and hot or cold temperatures. It may come from an infection or abscess (also called a boil or furuncle) around your tooth. It is also often caused by dental caries (tooth decay). This causes the pain you are having. °DIAGNOSIS  °Your caregiver can diagnose this problem by exam. °TREATMENT  °· If caused by an infection, it may be treated with medications which kill germs (antibiotics) and pain medications as prescribed by your caregiver. Take medications as directed. °· Only take over-the-counter or prescription medicines for pain, discomfort, or fever as directed by your caregiver. °· Whether the tooth ache today is caused by infection or dental disease, you should see your dentist as soon as possible for further care. °SEEK MEDICAL CARE IF: °The exam and treatment you received today has been provided on an emergency basis only. This is not a substitute for complete medical or dental care. If your problem worsens or new problems (symptoms) appear, and you are unable to meet with your dentist, call or return to this location. °SEEK IMMEDIATE MEDICAL CARE IF:  °· You have a fever. °· You develop redness and swelling of your face, jaw, or neck. °· You are unable to open your mouth. °· You have severe pain uncontrolled by pain medicine. °MAKE SURE YOU:  °· Understand these instructions. °· Will watch your condition. °· Will get help right away if you are not doing well or get worse. °Document Released: 09/03/2005 Document Revised: 11/26/2011 Document Reviewed: 04/21/2008 °ExitCare® Patient Information ©2015 [employer]. This information is not intended to replace advice given to you by your health care provider. Make sure you discuss any questions you have with your health care provider. ° ° °RESOURCE GUIDE ° °Chronic Pain Problems: °Contact  [facility]  297-2271 °Patients need to be  referred by their primary care doctor. ° °Insufficient Money for Medicine: °Contact United Way:  call "211" or [facility] 271-5999. ° °No Primary Care Doctor: °Call [facility]  832-8000 - can help you locate a primary care doctor that  accepts your insurance, provides certain services, etc. °Physician Referral Service- 1-800-533-3463 ° °Agencies that provide inexpensive medical care: °[facility]  [phone] °[facility]  832-7272 °[facility]  [phone] °[facility]  832-4777 °Planned Parenthood  373-0678 °[facility]  272-1050 ° °Medicaid-accepting [place] Providers: °[facility]- [address] ° [phone], Mon-Fri 9am-7pm, Sat 9am-1pm °[facility]- [address], [address] ° [phone] °[facility]- [address] ° [phone] °[facility]- [address] [place] [address] ° [phone] °[facility]- [address], [address], [phone] ° Only accepts [place] Access Medicaid patients after they have their name  applied to their card ° °Self Pay (no insurance) in [place]: °Sickle Cell Patients: Dr Eric Dean, [facility] ° [address], [phone] °[facility]- [address] ° [phone] °      -     [facility]- [address] °      -     [facility]- see information above (Speak to Pam H if you do not have insurance) °      -  [facility]- [address], [phone] °      -  [facility] [place]- [address],  [phone] °      -  [facility]- [address] [place] [address], [phone] °      -  Dr Osei-Bonsu-  [address], [place], [phone] °      -  [facility]- [address], [phone] °      -  [facility]- [address] [place] [address], [phone], also [address], [phone] °      -     [facility]- [address], [phone], 1st & 3rd Saturday   every month, 10am-1pm ° °1) Find a Doctor and Pay Out of Pocket °Although you won't have to find out who is covered by your insurance plan, it is a good idea to ask around and get recommendations. You will then need to call the office and see if the doctor you have chosen will accept you as a new patient and what types of options they offer for patients who are self-pay. Some doctors offer discounts or will set up payment plans for their patients who do not have insurance, but you will need to ask so you aren't surprised when you get to your appointment. ° °2) Contact Your Local Health Department °Not all health departments have doctors that can see patients for sick visits, but many do, so it is worth a call to see if yours does. If you don't know where your local health department is, you can check in your phone book. The CDC also has a tool to help you locate your state's health department, and many state websites also have listings of all of their local health departments. ° °3) Find a Walk-in Clinic °If your illness is not likely to be very severe or complicated, you may want to try a walk in clinic. These are popping up all over the country in pharmacies, drugstores, and shopping centers. They're usually staffed by nurse practitioners or physician assistants that have been trained to treat common illnesses and complaints. They're usually fairly quick and inexpensive. However, if you have serious medical issues or chronic medical problems, these are probably not your best option ° °STD Testing °[place] [facility] [place], [facility] Clinic, [address], [place], phone [phone] or 1-877-539-9860.  Monday - Friday, call for an appointment. °[place] [facility] [place], [facility] Clinic, [address], [place], phone [phone] or 1-877-539-9860.  Monday - Friday, call for an  appointment. ° °Abuse/Neglect: °[place] Child Abuse Hotline (336) 641-3795 °[place] Child Abuse Hotline 800-378-5315 (After Hours) ° °Emergency Shelter:  [facility] (336) 271-5985 ° °Maternity Homes: °[facility] (336) 275-9566 °[facility] (704) 372-4663 ° °MRSA Hotline #:   832-7006 ° °[place] Resources ° °[facility] [place]  [employer] [place] [facility] °[address]                 [address]         [address]  °[place]                                               [place]                                [place] °Phone:  349-3220                                  Phone:  [phone]                   Phone:  342-8140 ° °[facility] [place] [facility], 342-8316 °[place] [facility] - [facility]- 1-888-581-9988 °      -     [facility] in [place], [address],                                  336-349-4454, Insurance ° °[place] Child Abuse Hotline °(336) 342-1394 or (336) 342-3537 (After Hours) ° ° °Behavioral Health Services ° °Substance Abuse Resources: °[facility]  336-882-2125 °[facility] 336-784-9470 °[facility] 336-285-9073 °[facility] 336-845-3988 °Residential & Outpatient Substance Abuse Program  800-659-3381 ° °Psychological Services: °[facility]  832-9600 °[facility]  378-7881 °[facility], [address], [place], ACCESS LINE: 1-800-853-5163 or 336-641-4981, [email or web address].[email or web address] ° °Dental Assistance ° °If unable to pay or uninsured, contact:  [facility] or [facility] [facility] to become qualified for the adult dental clinic. ° °Patients with Medicaid: [facility] °[address], 632-0744 °[address], 510-2600 ° °If unable to pay, or uninsured, contact  [facility] (271-5999) or [place] [facility] (641-3152 in [place], 842-7733 in [place]) to become qualified for the adult dental clinic ° °Other Low-Cost Community Dental Services: °[facility]- [address], [place], [place], [address], 723-1848, Ext. 123, 2nd and 4th Thursday of the month at 6:30am.  10 clients each day by appointment, can sometimes see walk-in patients if someone does not show for an appointment. °[facility]- [address], [place], [place], [address], [phone] °[facility]- [address], [place], [place], [address], 631-2330 °[facility]- 342-8273 °[facility]- 703-3100 °[facility]- 570-6415 ° °Please make every effort to establish with a primary care physician for routine medical care ° °Adult Health Services  °The [place] [facility] provides a wide range of adult health services. Some of these services are designed to address the healthcare needs of all [place] residents and all services are designed to meet the needs of uninsured/underinsured low income residents. Some services are available to any resident of [place], call [phone] for details. °] °[facility], a new medical clinic for adults, is now open. For more information about the Center and its services please call [phone]. °For information on our [facility] services, click here. ° °For more information on any of the following Department of Public Health programs, including hours of service, click on the highlighted link. ° °SERVICES FOR WOMEN (Adults and Teens) °Family Planning Services provide a full range of birth control options plus education and counseling. New patient visit and annual return visits include a complete examination, pap test as indicated, and other laboratory as indicated. Included is our Regional Vasectomy Program  for men. ° °Maternity Care is provided through pregnancy, including a six week post partum exam. Women who meet eligibility criteria for the Medicaid for Pregnant Women program, receive care free. Other women are charged on a sliding scale according to income. °Note: Our   Dental Clinic provides services to pregnant women who have a Medicaid card. Call [phone] for an appointment in [place] or [phone] for an appointment in [place]. ° °Primary Care for Medicaid [place] Access Women is available through the [place] [facility]. As primary care provider for the [place] Medicaid [place] Access Medicaid Managed Care program, women may designate the Women’s Health clinic as their primary care provider. ° °PLEASE CALL [phone] FOR AN APPOINTMENT FOR THE ABOVE SERVICES IN EITHER [place] OR [place]. Information available in English and Spanish.  ° °Childbirth Education Classes are open to the public and offered to help families prepare for the best possible childbirth experience as well as to promote lifelong health and wellness. Classes are offered throughout the year and meet on the same night once a week for five weeks. Medicaid covers the cost of the classes for the mother-to-be and her partner. For participants without Medicaid, the cost of the class series is $45.00 for the mother-to-be and her partner. Class size is limited and registration is required. For more information or to register call 336-641-4718. Baby items donated by [employer] and the [employer] [place] are given away during each class series. ° °SERVICES FOR WOMEN AND MEN °Sexually Transmitted Infection appointments, including HIV testing, are available daily (weekdays, except holidays). Call early as same-day appointments are limited. For an appointment in either [place] or [place], call [phone]. Services are confidential and free of charge. ° °Skin Testing for Tuberculosis Please call  [phone]. °Adult Immunizations are available, usually for a fee. Please call [phone] for details. ° °PLEASE CALL [phone] FOR AN APPOINTMENT FOR THE ABOVE SERVICES IN EITHER [place] OR [place].  ° °[facility] provides up to the minute recommended vaccines for your travel destination. We also provide essential health and political information to help insure a safe and pleasurable travel experience. This program is self-sustaining, however, fees are very competitive. We are a CERTIFIED YELLOW FEVER IMMUNIZATION approved clinic site. °PLEASE CALL [phone] FOR AN APPOINTMENT IN EITHER [place] OR [place].  ° °If you have questions about the services listed above, we want to answer them! Email us at: [email or web address] °Home Visiting Services for elderly and the disabled are available to residents of [place] who are in need of care that compares to the care offered by a nursing home, have needs that can be met by the program, and have [place] Medicaid. Other short term services are available to residents 18 years and older who are unable to meet requirements for eligibility to receive services from a certified home health agency, spend the majority of time at home, and need care for six months or less. ° °PLEASE CALL [phone] OR [phone] FOR MORE INFORMATION. °Medication Assistance Program serves as a link between pharmaceutical companies and patients to provide low cost or free prescription medications. This servce is available for residents who meet certain income restrictions and have no insurance coverage. ° °PLEASE CALL [phone] ([place]) OR [phone] ([place]) FOR MORE INFORMATION.  °Updated Feb. 21, 2013 ° ° °

## 2015-07-05 ENCOUNTER — Emergency Department (HOSPITAL_COMMUNITY)
Admission: EM | Admit: 2015-07-05 | Discharge: 2015-07-06 | Disposition: A | Discharge status: Home / Self Care | Payer: Commercial Managed Care - HMO | Attending: Emergency Medicine | Admitting: Emergency Medicine

## 2015-07-05 ENCOUNTER — Encounter (HOSPITAL_COMMUNITY): Payer: Self-pay | Admitting: Vascular Surgery

## 2015-07-05 DIAGNOSIS — R1030 Lower abdominal pain, unspecified: Secondary | ICD-10-CM | POA: Insufficient documentation

## 2015-07-05 DIAGNOSIS — R197 Diarrhea, unspecified: Secondary | ICD-10-CM | POA: Insufficient documentation

## 2015-07-05 DIAGNOSIS — Z720 Tobacco use: Secondary | ICD-10-CM | POA: Insufficient documentation

## 2015-07-05 DIAGNOSIS — R1031 Right lower quadrant pain: Secondary | ICD-10-CM | POA: Insufficient documentation

## 2015-07-05 DIAGNOSIS — G8929 Other chronic pain: Secondary | ICD-10-CM | POA: Diagnosis not present

## 2015-07-05 DIAGNOSIS — R6883 Chills (without fever): Secondary | ICD-10-CM | POA: Insufficient documentation

## 2015-07-05 DIAGNOSIS — R109 Unspecified abdominal pain: Secondary | ICD-10-CM

## 2015-07-05 DIAGNOSIS — K5900 Constipation, unspecified: Secondary | ICD-10-CM | POA: Diagnosis not present

## 2015-07-05 DIAGNOSIS — Z3202 Encounter for pregnancy test, result negative: Secondary | ICD-10-CM | POA: Diagnosis not present

## 2015-07-05 DIAGNOSIS — R1032 Left lower quadrant pain: Secondary | ICD-10-CM | POA: Insufficient documentation

## 2015-07-05 DIAGNOSIS — K921 Melena: Secondary | ICD-10-CM | POA: Diagnosis not present

## 2015-07-05 LAB — COMPREHENSIVE METABOLIC PANEL
ALK PHOS: 38 U/L (ref 38–126)
ALT: 35 U/L (ref 14–54)
AST: 38 U/L (ref 15–41)
Albumin: 4.1 g/dL (ref 3.5–5.0)
Anion gap: 10 (ref 5–15)
BUN: 9 mg/dL (ref 6–20)
CALCIUM: 9.1 mg/dL (ref 8.9–10.3)
CO2: 23 mmol/L (ref 22–32)
CREATININE: 0.74 mg/dL (ref 0.44–1.00)
Chloride: 99 mmol/L — ABNORMAL LOW (ref 101–111)
GFR calc Af Amer: >60 mL/min (ref >60)
Glucose, Bld: 112 mg/dL — ABNORMAL HIGH (ref 65–99)
Potassium: 3.6 mmol/L (ref 3.5–5.1)
Sodium: 132 mmol/L — ABNORMAL LOW (ref 135–145)
Total Bilirubin: 0.3 mg/dL (ref 0.3–1.2)
Total Protein: 7 g/dL (ref 6.5–8.1)

## 2015-07-05 LAB — CBC
HCT: 36.4 % (ref 36.0–46.0)
Hemoglobin: 11.9 g/dL — ABNORMAL LOW (ref 12.0–15.0)
MCH: 28.3 pg (ref 26.0–34.0)
MCHC: 32.7 g/dL (ref 30.0–36.0)
MCV: 86.7 fL (ref 78.0–100.0)
PLATELETS: 261 10*3/uL (ref 150–400)
RBC: 4.2 MIL/uL (ref 3.87–5.11)
RDW: 13.7 % (ref 11.5–15.5)
WBC: 8 10*3/uL (ref 4.0–10.5)

## 2015-07-05 LAB — I-STAT BETA HCG BLOOD, ED (MC, WL, AP ONLY): I-stat hCG, quantitative: <5 m[IU]/mL (ref <5)

## 2015-07-05 LAB — LIPASE, BLOOD: LIPASE: 26 U/L (ref 22–51)

## 2015-07-05 MED ORDER — DICYCLOMINE HCL 10 MG PO CAPS
10.0000 mg | ORAL_CAPSULE | Freq: Once | ORAL | Status: AC
  Administered 2015-07-05: 10 mg via ORAL
  Filled 2015-07-05: qty 1

## 2015-07-05 MED ORDER — TRAMADOL HCL 50 MG PO TABS
50.0000 mg | ORAL_TABLET | Freq: Once | ORAL | Status: AC
  Administered 2015-07-05: 50 mg via ORAL
  Filled 2015-07-05: qty 1

## 2015-07-05 MED ORDER — ONDANSETRON 4 MG PO TBDP
4.0000 mg | ORAL_TABLET | Freq: Once | ORAL | Status: AC
  Administered 2015-07-05: 4 mg via ORAL
  Filled 2015-07-05: qty 1

## 2015-07-05 NOTE — ED Notes (Signed)
MD at bedside. 

## 2015-07-05 NOTE — ED Provider Notes (Signed)
CSN: [id number]   Arrival date & time 07/05/15 2157  History  By signing my name below, I, Bethel BornBritney McCollum, attest that this documentation has been prepared under the direction and in the presence of Loren Raceravid Chivon Lepage, MD. Electronically Signed: Bethel BornBritney McCollum, ED Scribe. 07/05/2015. 11:41 PM.  Chief Complaint  Patient presents with  . Abdominal Pain    HPI The history is provided by the patient. No language interpreter was used.   Cathy Lester is a 34 y.o. female with history of colitis who presents to the Emergency Department complaining of ongoing pain in the lower abdomen (L>R) with onset several months ago. The constant pain worsened tonight at work. She describes the pain as cramping that is variably worse with eating and improved after a bowel movement.  Frequent ibuprofen use has provided insufficient pain relief at home. Associated symptoms include chills, 1 episode of rectal bleeding last week. She alternates between constipation and diarrhea. Pt denies fever, sweating, dysuria, hematuria, increased urinary frequency, abnormal vaginal discharge, and abnormal vaginal bleeding. The last time that she was seen for this pain she was discharged with abx to f/u with GI. She states that the abx helped the abdominal pain but caused a headache and dizziness. Pt attempted to f/u with GI but was unable to have a colonoscopy due to financial constraint. Recently completed abx and pain medication after a dental visit. LNMP was 1 week ago and she is regular.   Past Medical History  Diagnosis Date  . Colitis     History reviewed. No pertinent past surgical history.  No family history on file.  Social History  Substance Use Topics  . Smoking status: Current Every Day Smoker -- 0.50 packs/day    Types: Cigarettes  . Smokeless tobacco: None  . Alcohol Use: No     Review of Systems  Constitutional: Positive for chills. Negative for fever.  Respiratory: Negative for shortness of breath.    Cardiovascular: Negative for chest pain.  Gastrointestinal: Positive for abdominal pain, diarrhea, constipation and blood in stool. Negative for nausea and vomiting.  Genitourinary: Negative for dysuria, frequency, hematuria, vaginal bleeding, vaginal discharge, difficulty urinating and pelvic pain.  Musculoskeletal: Negative for myalgias, back pain, neck pain and neck stiffness.  Skin: Negative for rash and wound.  Neurological: Negative for dizziness, weakness, light-headedness, numbness and headaches.  All other systems reviewed and are negative.  Home Medications   Prior to Admission medications   Medication Sig Start Date End Date Taking? Authorizing Provider  acetaminophen (TYLENOL) 325 MG tablet Take 650 mg by mouth every 6 (six) hours as needed for mild pain or headache.    Yes Historical Provider, MD  dicyclomine (BENTYL) 20 MG tablet Take 1 tablet (20 mg total) by mouth 2 (two) times daily as needed for spasms. 07/06/15   Loren Raceravid Kattaleya Alia, MD  ondansetron (ZOFRAN ODT) 4 MG disintegrating tablet 4mg  ODT q4 hours prn nausea/vomit 07/06/15   Loren Raceravid Diyan Dave, MD  traMADol (ULTRAM) 50 MG tablet Take 1 tablet (50 mg total) by mouth every 6 (six) hours as needed for moderate pain or severe pain. 07/06/15   Loren Raceravid Collin Hendley, MD    Allergies  Morphine and related  Triage Vitals: BP 122/77 mmHg  Pulse 74  Temp(Src) 97.8 F (36.6 C) (Oral)  Resp 14  SpO2 99%  Physical Exam  Constitutional: She is oriented to person, place, and time. She appears well-developed and well-nourished. No distress.  HENT:  Head: Normocephalic and atraumatic.  Mouth/Throat: Oropharynx is clear and  moist.  Eyes: EOM are normal. Pupils are equal, round, and reactive to light.  Neck: Normal range of motion. Neck supple.  Cardiovascular: Normal rate and regular rhythm.   Pulmonary/Chest: Effort normal and breath sounds normal. No respiratory distress. She has no wheezes. She has no rales.  Abdominal: Soft.  Bowel sounds are normal. She exhibits no distension and no mass. There is tenderness (patient with tenderness to palpation in the right lower, suprapubic and left lower quadrants. There is no rebound or guarding.). There is no rebound and no guarding.  Musculoskeletal: Normal range of motion. She exhibits no edema or tenderness.  Mild left CVA tenderness.  Neurological: She is alert and oriented to person, place, and time.  Moves all extremities without deficit. sensation fully intact.  Skin: Skin is warm and dry. No rash noted. No erythema.  Psychiatric: She has a normal mood and affect. Her behavior is normal.  Nursing note and vitals reviewed.   ED Course  Procedures   DIAGNOSTIC STUDIES: Oxygen Saturation is 99% on RA, normal by my interpretation.    COORDINATION OF CARE: 11:31 PM Discussed treatment plan which includes lab work and pain management with pt at bedside and pt agreed to plan.  Labs Reviewed  COMPREHENSIVE METABOLIC PANEL - Abnormal; Notable for the following:    Sodium 132 (*)    Chloride 99 (*)    Glucose, Bld 112 (*)    All other components within normal limits  CBC - Abnormal; Notable for the following:    Hemoglobin 11.9 (*)    All other components within normal limits  URINALYSIS, ROUTINE W REFLEX MICROSCOPIC (NOT AT [facility]) - Abnormal; Notable for the following:    APPearance CLOUDY (*)    All other components within normal limits  LIPASE, BLOOD  OCCULT BLOOD X 1 CARD TO LAB, STOOL  I-STAT BETA HCG BLOOD, ED ([facility], [facility], [facility] ONLY)  POC OCCULT BLOOD, ED    Imaging Review No results found.  I personally reviewed and evaluated these images and lab results as a part of my medical decision-making.    MDM   Final diagnoses:  Chronic abdominal pain     I, Aoife Bold, personally performed the services described in this documentation. All medical record entries made by the scribe were at my direction and in my presence.  I have reviewed the chart and  discharge instructions and agree that the record reflects my personal performance and is accurate and complete. Leasha Goldberger.  07/06/2015. 2:26 AM.   Patient presents with chronic abdominal pain. CT scan in April demonstrated colitis. Patient has been unable to obtain colonoscopy due to financial reasons. Patient states this pain is the same as her chronic pain. Worsened this evening at work. She's having no pelvic complaints.  Patient states that her pain has improved. Vital signs remained stable. Normal laboratory workup. Do not believe that further imaging is necessary given that the pain is identical to the pain she's had for the last 8 months. Patient has been evaluated by OB/GYN and gastroenterology. She understands that she needs a colonoscopy for further evaluation. We'll [facility] the [facility]..Given normal white count and chronic nature of the abdominal pain, believe patient's colitis likely due to inflammatory cause. Will not start antibiotics at this time. She understands the need to return for worsening pain, fever, persistent vomiting or for any concerns.   Loren Racer, MD 07/06/15 [id number]

## 2015-07-05 NOTE — ED Notes (Signed)
Pt reports to the ED for eval of left lower quadrant abd pain/left groin pain x 8 months. She has been seen for this before and was not given a dx and she was referred to the GI specialist and she was told she needed a colonoscopy but it was $500 so she couldn't get it done. She reports today the pain became more severe. Pt reports nausea but denies any V/D, vaginal bleeding, d/c, or urinary symptoms. Reports chills unknown fever. Pt A&Ox4, resp e/u, and skin warm and dry.

## 2015-07-06 LAB — URINALYSIS, ROUTINE W REFLEX MICROSCOPIC
Bilirubin Urine: NEGATIVE
GLUCOSE, UA: NEGATIVE mg/dL
Hgb urine dipstick: NEGATIVE
KETONES UR: NEGATIVE mg/dL
LEUKOCYTES UA: NEGATIVE
NITRITE: NEGATIVE
Protein, ur: NEGATIVE mg/dL
Specific Gravity, Urine: 1.023 (ref 1.005–1.030)
UROBILINOGEN UA: 1 mg/dL (ref 0.0–1.0)
pH: 7 (ref 5.0–8.0)

## 2015-07-06 LAB — POC OCCULT BLOOD, ED: Fecal Occult Bld: NEGATIVE

## 2015-07-06 MED ORDER — DICYCLOMINE HCL 20 MG PO TABS: 20.0000 mg | ORAL_TABLET | Freq: Two times a day (BID) | ORAL | Status: DC | PRN

## 2015-07-06 MED ORDER — ONDANSETRON 4 MG PO TBDP: ORAL_TABLET | ORAL | Status: DC

## 2015-07-06 MED ORDER — TRAMADOL HCL 50 MG PO TABS: 50.0000 mg | ORAL_TABLET | Freq: Four times a day (QID) | ORAL | Status: DC | PRN

## 2015-07-06 NOTE — ED Notes (Signed)
Discharge instructions/prescriptions reviewed with patient. Understanding verbalized. Patient declined wheelchair at time of discharge. 

## 2015-07-06 NOTE — Discharge Instructions (Signed)
°  Colitis °Colitis is inflammation of the colon. Colitis may last a short time (acute) or it may last a long time (chronic). °CAUSES °This condition may be caused by: °· Viruses. °· Bacteria. °· Reactions to medicine. °· Certain autoimmune diseases, such as Crohn disease or ulcerative colitis. °SYMPTOMS °Symptoms of this condition include: °· Diarrhea. °· Passing bloody or tarry stool. °· Pain. °· Fever. °· Vomiting. °· Tiredness (fatigue). °· Weight loss. °· Bloating. °· Sudden increase in abdominal pain. °· Having fewer bowel movements than usual. °DIAGNOSIS °This condition is diagnosed with a stool test or a blood test. You may also have other tests, including X-rays, a CT scan, or a colonoscopy. °TREATMENT °Treatment may include: °· Resting the bowel. This involves not eating or drinking for a period of time. °· Fluids that are given through an IV tube. °· Medicine for pain and diarrhea. °· Antibiotic medicines. °· Cortisone medicines. °· Surgery. °HOME CARE INSTRUCTIONS °Eating and Drinking °· Follow instructions from your health care provider about eating or drinking restrictions. °· Drink enough fluid to keep your urine clear or pale yellow. °· Work with a dietitian to determine which foods cause your condition to flare up. °· Avoid foods that cause flare-ups. °· Eat a well-balanced diet. °Medicines °· Take over-the-counter and prescription medicines only as told by your health care provider. °· If you were prescribed an antibiotic medicine, take it as told by your health care provider. Do not stop taking the antibiotic even if you start to feel better. °General Instructions °· Keep all follow-up visits as told by your health care provider. This is important. °SEEK MEDICAL CARE IF: °· Your symptoms do not go away. °· You develop new symptoms. °SEEK IMMEDIATE MEDICAL CARE IF: °· You have a fever that does not go away with treatment. °· You develop chills. °· You have extreme weakness, fainting, or  dehydration. °· You have repeated vomiting. °· You develop severe pain in your abdomen. °· You pass bloody or tarry stool. °  °This information is not intended to replace advice given to you by your health care provider. Make sure you discuss any questions you have with your health care provider. °  °Document Released: 10/11/2004 Document Revised: 05/25/2015 Document Reviewed: 12/27/2014 °Elsevier Interactive Patient Education ©2016 Elsevier Inc. ° °

## 2015-07-07 ENCOUNTER — Telehealth (HOSPITAL_BASED_OUTPATIENT_CLINIC_OR_DEPARTMENT_OTHER): Payer: Self-pay | Admitting: Emergency Medicine

## 2015-07-08 ENCOUNTER — Ambulatory Visit (INDEPENDENT_AMBULATORY_CARE_PROVIDER_SITE_OTHER): Payer: Commercial Managed Care - HMO | Admitting: Family Medicine

## 2015-07-08 ENCOUNTER — Encounter: Payer: Self-pay | Admitting: Family Medicine

## 2015-07-08 VITALS — BP 128/86 | HR 67 | Ht 67.0 in | Wt 194.0 lb

## 2015-07-08 DIAGNOSIS — G8929 Other chronic pain: Secondary | ICD-10-CM

## 2015-07-08 DIAGNOSIS — R109 Unspecified abdominal pain: Secondary | ICD-10-CM | POA: Diagnosis not present

## 2015-07-08 NOTE — Patient Instructions (Addendum)
Thank you for coming in today. Follow up with [facility].   Address: [address], [place], [place] [address] Phone: [phone]  Return after colonoscopy is done.  If your belly pain worsens, or you have high fever, bad vomiting, blood in your stool or black tarry stool go to the Emergency Room.   Colitis Colitis is inflammation of the colon. Colitis may last a short time (acute) or it may last a long time (chronic). CAUSES This condition may be caused by:  Viruses.  Bacteria.  Reactions to medicine.  Certain autoimmune diseases, such as Crohn disease or ulcerative colitis. SYMPTOMS Symptoms of this condition include:  Diarrhea.  Passing bloody or tarry stool.  Pain.  Fever.  Vomiting.  Tiredness (fatigue).  Weight loss.  Bloating.  Sudden increase in abdominal pain.  Having fewer bowel movements than usual. DIAGNOSIS This condition is diagnosed with a stool test or a blood test. You may also have other tests, including X-rays, a CT scan, or a colonoscopy. TREATMENT Treatment may include:  Resting the bowel. This involves not eating or drinking for a period of time.  Fluids that are given through an IV tube.  Medicine for pain and diarrhea.  Antibiotic medicines.  Cortisone medicines.  Surgery. HOME CARE INSTRUCTIONS Eating and Drinking  Follow instructions from your health care provider about eating or drinking restrictions.  Drink enough fluid to keep your urine clear or pale yellow.  Work with a dietitian to determine which foods cause your condition to flare up.  Avoid foods that cause flare-ups.  Eat a well-balanced diet. Medicines  Take over-the-counter and prescription medicines only as told by your health care provider.  If you were prescribed an antibiotic medicine, take it as told by your health care provider. Do not stop taking the antibiotic even if you start to feel better. General Instructions  Keep all  follow-up visits as told by your health care provider. This is important. SEEK MEDICAL CARE IF:  Your symptoms do not go away.  You develop new symptoms. SEEK IMMEDIATE MEDICAL CARE IF:  You have a fever that does not go away with treatment.  You develop chills.  You have extreme weakness, fainting, or dehydration.  You have repeated vomiting.  You develop severe pain in your abdomen.  You pass bloody or tarry stool.   This information is not intended to replace advice given to you by your health care provider. Make sure you discuss any questions you have with your health care provider.   Document Released: 10/11/2004 Document Revised: 05/25/2015 Document Reviewed: 12/27/2014 Elsevier Interactive Patient Education [employer]2016 Elsevier Inc.

## 2015-07-08 NOTE — Progress Notes (Signed)
Cathy Lester is a 34 y.o. female who presents to [facility]: Primary Care  today for chronic abdominal pain. Patient is approximately eight-month history of chronic abdominal pain. She denies any fevers or chills but does note some nausea and diarrhea. She denies any significant blood in her stool. She's had several ER visits for this issue. A CT scan in April showed colitis. She was referred to [facility] where she was recommended to have a colonoscopy. She cannot afford a colonoscopy at the time. She was lost to follow-up. Subsequently she return to the emergency room a few days ago where she was referred to [facility]. She has continued moderate pain.   Past Medical History  Diagnosis Date  . Colitis    History reviewed. No pertinent past surgical history. Social History  Substance Use Topics  . Smoking status: Current Every Day Smoker -- 0.50 packs/day    Types: Cigarettes  . Smokeless tobacco: Not on file  . Alcohol Use: No   family history is not on file.  ROS as above Medications: Current Outpatient Prescriptions  Medication Sig Dispense Refill  . dicyclomine (BENTYL) 20 MG tablet Take 1 tablet (20 mg total) by mouth 2 (two) times daily as needed for spasms. 20 tablet 0  . ondansetron (ZOFRAN ODT) 4 MG disintegrating tablet 4mg  ODT q4 hours prn nausea/vomit 10 tablet 0  . traMADol (ULTRAM) 50 MG tablet Take 1 tablet (50 mg total) by mouth every 6 (six) hours as needed for moderate pain or severe pain. 20 tablet 0   No current facility-administered medications for this visit.   Allergies  Allergen Reactions  . Morphine And Related Nausea And Vomiting     Exam:  BP 128/86 mmHg  Pulse 67  Ht 5\' 7"  (1.702 m)  Wt 194 lb (87.998 kg)  BMI 30.38 kg/m2 Gen: Well NAD HEENT: EOMI,  MMM Lungs: Normal work of breathing. CTABL Heart: RRR no MRG Abd: NABS, Soft. Nondistended, mildly tender left lower quadrant without guarding or  rebound. Exts: Brisk capillary refill, warm and well perfused.   No results found for this or any previous visit (from the past 24 hour(s)). No results found.   Please see individual assessment and plan sections.

## 2015-07-08 NOTE — Assessment & Plan Note (Signed)
Likely colitis concerning for inflammatory bowel disease. Referral to [facility]. Patient will likely require colonoscopy for further diagnosis. Return following colonoscopy.

## 2015-07-12 NOTE — Progress Notes (Signed)
Patient has an appointment with digestive health specialists on November 1 for evaluation of chronic abdominal pain.

## 2015-07-14 ENCOUNTER — Ambulatory Visit: Payer: PRIVATE HEALTH INSURANCE | Admitting: Adult Health

## 2015-07-20 ENCOUNTER — Ambulatory Visit: Payer: Commercial Managed Care - HMO | Admitting: Family Medicine

## 2015-07-22 ENCOUNTER — Ambulatory Visit: Payer: Commercial Managed Care - HMO | Admitting: Family Medicine

## 2015-07-22 ENCOUNTER — Ambulatory Visit (INDEPENDENT_AMBULATORY_CARE_PROVIDER_SITE_OTHER): Payer: Commercial Managed Care - HMO | Admitting: Family Medicine

## 2015-07-22 ENCOUNTER — Encounter: Payer: Self-pay | Admitting: Family Medicine

## 2015-07-22 VITALS — BP 131/80 | HR 73 | Wt 199.0 lb

## 2015-07-22 DIAGNOSIS — K529 Noninfective gastroenteritis and colitis, unspecified: Secondary | ICD-10-CM

## 2015-07-22 NOTE — Progress Notes (Signed)
Cathy Lester is a 34 y.o. female who presents [name]  today for colitis.  Patient has been to [facility]. They recommended a colonoscopy. She is somewhat confused about the cost.  She's been told that her insurance will not approve it as she is not old enough that she'll have to pay $1200 before she can have the colonoscopy.  She states that she is feeling reasonably well and has intermittent flareups. She would like FMLA forms filled out as well to allow her to miss work for flareups and doctor visits.   Past Medical History  Diagnosis Date  . Colitis    No past surgical history on file. Social History  Substance Use Topics  . Smoking status: Current Every Day Smoker -- 0.50 packs/day    Types: Cigarettes  . Smokeless tobacco: Not on file  . Alcohol Use: No   family history is not on file.  ROS as above Medications: Current Outpatient Prescriptions  Medication Sig Dispense Refill  . dicyclomine (BENTYL) 20 MG tablet Take 1 tablet (20 mg total) by mouth 2 (two) times daily as needed for spasms. 20 tablet 0  . ondansetron (ZOFRAN ODT) 4 MG disintegrating tablet 4mg  ODT q4 hours prn nausea/vomit 10 tablet 0  . traMADol (ULTRAM) 50 MG tablet Take 1 tablet (50 mg total) by mouth every 6 (six) hours as needed for moderate pain or severe pain. 20 tablet 0   No current facility-administered medications for this visit.   Allergies  Allergen Reactions  . Morphine And Related Nausea And Vomiting     Exam:  BP 131/80 mmHg  Pulse 73  Wt 199 lb (90.266 kg) Gen: Well NAD HEENT: EOMI,  MMM Lungs: Normal work of breathing. CTABL Heart: RRR no MRG Abd: NABS, Soft. Nondistended, Nontender Exts: Brisk capillary refill, warm and well perfused.   No results found for this or any previous visit (from the past 24 hour(s)). No results found.   Please see individual assessment and plan sections.

## 2015-07-22 NOTE — Patient Instructions (Signed)
Thank you for coming in today. Return in a few months or sooner if needed.  If your belly pain worsens, or you have high fever, bad vomiting, blood in your stool or black tarry stool go to the Emergency Room.

## 2015-07-22 NOTE — Assessment & Plan Note (Signed)
Colitis is the cause of the chronic abdominal pain. At this time the etiology is unclear. I'm concerned for inflammatory bowel disease. I advised the patient to contact digestive health specialists as well as her insurance company to see if her insurance will pay for a diagnostic colonoscopy and that the claimant can be appealed.   Ultimately if she has to pay $1000 to get a colonoscopy that is the right test for her condition.  Return intermittently.

## 2015-09-22 ENCOUNTER — Ambulatory Visit (INDEPENDENT_AMBULATORY_CARE_PROVIDER_SITE_OTHER): Payer: Commercial Managed Care - HMO | Admitting: Family Medicine

## 2015-09-22 ENCOUNTER — Encounter: Payer: Self-pay | Admitting: Family Medicine

## 2015-09-22 VITALS — BP 149/82 | HR 89 | Wt 195.0 lb

## 2015-09-22 DIAGNOSIS — J069 Acute upper respiratory infection, unspecified: Secondary | ICD-10-CM | POA: Insufficient documentation

## 2015-09-22 LAB — POCT INFLUENZA A/B
INFLUENZA A, POC: NEGATIVE
INFLUENZA B, POC: NEGATIVE

## 2015-09-22 LAB — POCT RAPID STREP A (OFFICE): RAPID STREP A SCREEN: NEGATIVE

## 2015-09-22 MED ORDER — GUAIFENESIN-CODEINE 100-10 MG/5ML PO SOLN: 5.0000 mL | Freq: Every evening | ORAL | Status: DC | PRN

## 2015-09-22 MED ORDER — PREDNISONE 10 MG PO TABS: 30.0000 mg | ORAL_TABLET | Freq: Every day | ORAL | Status: DC

## 2015-09-22 MED ORDER — AZITHROMYCIN 250 MG PO TABS: 250.0000 mg | ORAL_TABLET | Freq: Every day | ORAL | Status: DC

## 2015-09-22 MED ORDER — IPRATROPIUM BROMIDE 0.06 % NA SOLN: 2.0000 | Freq: Four times a day (QID) | NASAL | Status: DC

## 2015-09-22 NOTE — Progress Notes (Signed)
       Cathy Lester is a 35 y.o. female who presents to [facility] [place] today for cough congestion sore throat and sinus pressure. Symptoms present for 2 weeks worsening 2 days ago. No new fevers chills nausea vomiting or diarrhea. She's tried over-the-counter medicines which have helped a bit.   Past Medical History  Diagnosis Date  . Colitis    No past surgical history on file. Social History  Substance Use Topics  . Smoking status: Current Every Day Smoker -- 0.50 packs/day    Types: Cigarettes  . Smokeless tobacco: Not on file  . Alcohol Use: No   family history is not on file.  ROS as above Medications: Current Outpatient Prescriptions  Medication Sig Dispense Refill  . azithromycin (ZITHROMAX) 250 MG tablet Take 1 tablet (250 mg total) by mouth daily. Take first 2 tablets together, then 1 every day until finished. 6 tablet 0  . dicyclomine (BENTYL) 20 MG tablet Take 1 tablet (20 mg total) by mouth 2 (two) times daily as needed for spasms. 20 tablet 0  . guaiFENesin-codeine 100-10 MG/5ML syrup Take 5 mLs by mouth at bedtime as needed for cough. 120 mL 0  . ipratropium (ATROVENT) 0.06 % nasal spray Place 2 sprays into both nostrils 4 (four) times daily. 15 mL 1  . ondansetron (ZOFRAN ODT) 4 MG disintegrating tablet 4mg  ODT q4 hours prn nausea/vomit 10 tablet 0  . predniSONE (DELTASONE) 10 MG tablet Take 3 tablets (30 mg total) by mouth daily. 15 tablet 0  . traMADol (ULTRAM) 50 MG tablet Take 1 tablet (50 mg total) by mouth every 6 (six) hours as needed for moderate pain or severe pain. 20 tablet 0   No current facility-administered medications for this visit.   Allergies  Allergen Reactions  . Morphine And Related Nausea And Vomiting     Exam:  BP 149/82 mmHg  Pulse 89  Wt 195 lb (88.451 kg) Gen: Well NAD nontoxic HEENT: EOMI,  MMM clear nasal discharge. Nontender maxillary  sinuses. Posterior pharynx with cobblestoning. Normal tympanic membranes bilaterally. Lungs: Normal work of breathing. CTABL Heart: RRR no MRG Abd: NABS, Soft. Nondistended, Nontender Exts: Brisk capillary refill, warm and well perfused.   Results for orders placed or performed in visit on 09/22/15 (from the past 24 hour(s))  POCT rapid strep A     Status: None   Collection Time: 09/22/15  2:38 PM  Result Value Ref Range   Rapid Strep A Screen Negative Negative  POCT Influenza A/B     Status: None   Collection Time: 09/22/15  2:38 PM  Result Value Ref Range   Influenza A, POC Negative Negative   Influenza B, POC Negative Negative   No results found.   Please see individual assessment and plan sections.

## 2015-09-22 NOTE — Assessment & Plan Note (Signed)
Likely viral possibly bacterial with second sickening. Treat with prednisone and Atrovent nasal spray and azithromycin.

## 2015-09-22 NOTE — Patient Instructions (Signed)
Thank you for coming in today. Call or go to the emergency room if you get worse, have trouble breathing, have chest pains, or palpitations.   Upper Respiratory Infection, Adult Most upper respiratory infections (URIs) are a viral infection of the air passages leading to the lungs. A URI affects the nose, throat, and upper air passages. The most common type of URI is nasopharyngitis and is typically referred to as "the common cold." URIs run their course and usually go away on their own. Most of the time, a URI does not require medical attention, but sometimes a bacterial infection in the upper airways can follow a viral infection. This is called a secondary infection. Sinus and middle ear infections are common types of secondary upper respiratory infections. Bacterial pneumonia can also complicate a URI. A URI can worsen asthma and chronic obstructive pulmonary disease (COPD). Sometimes, these complications can require emergency medical care and may be life threatening.  CAUSES Almost all URIs are caused by viruses. A virus is a type of germ and can spread from one person to another.  RISKS FACTORS You may be at risk for a URI if:   You smoke.   You have chronic heart or lung disease.  You have a weakened defense (immune) system.   You are very young or very old.   You have nasal allergies or asthma.  You work in crowded or poorly ventilated areas.  You work in health care facilities or schools. SIGNS AND SYMPTOMS  Symptoms typically develop 2-3 days after you come in contact with a cold virus. Most viral URIs last 7-10 days. However, viral URIs from the influenza virus (flu virus) can last 14-18 days and are typically more severe. Symptoms may include:   Runny or stuffy (congested) nose.   Sneezing.   Cough.   Sore throat.   Headache.   Fatigue.   Fever.   Loss of appetite.   Pain in your forehead, behind your eyes, and over your cheekbones (sinus  pain).  Muscle aches.  DIAGNOSIS  Your health care provider may diagnose a URI by:  Physical exam.  Tests to check that your symptoms are not due to another condition such as:  Strep throat.  Sinusitis.  Pneumonia.  Asthma. TREATMENT  A URI goes away on its own with time. It cannot be cured with medicines, but medicines may be prescribed or recommended to relieve symptoms. Medicines may help:  Reduce your fever.  Reduce your cough.  Relieve nasal congestion. HOME CARE INSTRUCTIONS   Take medicines only as directed by your health care provider.   Gargle warm saltwater or take cough drops to comfort your throat as directed by your health care provider.  Use a warm mist humidifier or inhale steam from a shower to increase air moisture. This may make it easier to breathe.  Drink enough fluid to keep your urine clear or pale yellow.   Eat soups and other clear broths and maintain good nutrition.   Rest as needed.   Return to work when your temperature has returned to normal or as your health care provider advises. You may need to stay home longer to avoid infecting others. You can also use a face mask and careful hand washing to prevent spread of the virus.  Increase the usage of your inhaler if you have asthma.   Do not use any tobacco products, including cigarettes, chewing tobacco, or electronic cigarettes. If you need help quitting, ask your health care provider. PREVENTION    The best way to protect yourself from getting a cold is to practice good hygiene.   Avoid oral or hand contact with people with cold symptoms.   Wash your hands often if contact occurs.  There is no clear evidence that vitamin C, vitamin E, echinacea, or exercise reduces the chance of developing a cold. However, it is always recommended to get plenty of rest, exercise, and practice good nutrition.  SEEK MEDICAL CARE IF:   You are getting worse rather than better.   Your symptoms are  not controlled by medicine.   You have chills.  You have worsening shortness of breath.  You have brown or red mucus.  You have yellow or brown nasal discharge.  You have pain in your face, especially when you bend forward.  You have a fever.  You have swollen neck glands.  You have pain while swallowing.  You have white areas in the back of your throat. SEEK IMMEDIATE MEDICAL CARE IF:   You have severe or persistent:  Headache.  Ear pain.  Sinus pain.  Chest pain.  You have chronic lung disease and any of the following:  Wheezing.  Prolonged cough.  Coughing up blood.  A change in your usual mucus.  You have a stiff neck.  You have changes in your:  Vision.  Hearing.  Thinking.  Mood. MAKE SURE YOU:   Understand these instructions.  Will watch your condition.  Will get help right away if you are not doing well or get worse.   This information is not intended to replace advice given to you by your health care provider. Make sure you discuss any questions you have with your health care provider.   Document Released: 02/27/2001 Document Revised: 01/18/2015 Document Reviewed: 12/09/2013 Elsevier Interactive Patient Education [employer].

## 2015-09-28 ENCOUNTER — Encounter (HOSPITAL_COMMUNITY): Payer: Self-pay | Admitting: *Deleted

## 2015-09-28 ENCOUNTER — Emergency Department (INDEPENDENT_AMBULATORY_CARE_PROVIDER_SITE_OTHER): Payer: Commercial Managed Care - HMO

## 2015-09-28 ENCOUNTER — Emergency Department (INDEPENDENT_AMBULATORY_CARE_PROVIDER_SITE_OTHER)
Admission: EM | Admit: 2015-09-28 | Discharge: 2015-09-28 | Disposition: A | Discharge status: Home / Self Care | Payer: Commercial Managed Care - HMO | Source: Home / Self Care

## 2015-09-28 DIAGNOSIS — M7071 Other bursitis of hip, right hip: Secondary | ICD-10-CM | POA: Diagnosis not present

## 2015-09-28 DIAGNOSIS — R52 Pain, unspecified: Secondary | ICD-10-CM | POA: Diagnosis not present

## 2015-09-28 MED ORDER — NAPROXEN 500 MG PO TABS: 500.0000 mg | ORAL_TABLET | Freq: Two times a day (BID) | ORAL | Status: DC

## 2015-09-28 NOTE — Discharge Instructions (Signed)
Use ice and medicine as needed and see orthopedist soon for recheck and further care as needed.

## 2015-09-28 NOTE — ED Notes (Signed)
Pt reports     Pain  r  Leg   X  2  Months      Pt        States        Seen   Last   Week      For       Uri        Pt   Reports          Symptoms        Not  releived  By  [facility]  meds

## 2015-09-28 NOTE — ED Provider Notes (Signed)
CSN: [id number]     Arrival date & time 09/28/15  1922 History   None    Chief Complaint  Patient presents with  . Leg Pain   (Consider location/radiation/quality/duration/timing/severity/associated sxs/prior Treatment) Patient is a 35 y.o. female presenting with hip pain. The history is provided by the patient.  Hip Pain This is a chronic problem. Episode onset: 19mo hx. The problem has been gradually worsening. Pertinent negatives include no chest pain and no abdominal pain. The symptoms are aggravated by walking.    Past Medical History  Diagnosis Date  . Colitis    History reviewed. No pertinent past surgical history. History reviewed. No pertinent family history. Social History  Substance Use Topics  . Smoking status: Current Every Day Smoker -- 0.50 packs/day    Types: Cigarettes  . Smokeless tobacco: None  . Alcohol Use: No   OB History    No data available     Review of Systems  Constitutional: Negative.   Cardiovascular: Negative for chest pain.  Gastrointestinal: Negative.  Negative for abdominal pain.  Musculoskeletal: Positive for gait problem. Negative for back pain and joint swelling.  Skin: Negative.   All other systems reviewed and are negative.   Allergies  Morphine and related  Home Medications   Prior to Admission medications   Medication Sig Start Date End Date Taking? Authorizing Provider  azithromycin (ZITHROMAX) 250 MG tablet Take 1 tablet (250 mg total) by mouth daily. Take first 2 tablets together, then 1 every day until finished. 09/22/15   Rodolph Bong, MD  dicyclomine (BENTYL) 20 MG tablet Take 1 tablet (20 mg total) by mouth 2 (two) times daily as needed for spasms. 07/06/15   Loren Racer, MD  guaiFENesin-codeine 100-10 MG/5ML syrup Take 5 mLs by mouth at bedtime as needed for cough. 09/22/15   Rodolph Bong, MD  ipratropium (ATROVENT) 0.06 % nasal spray Place 2 sprays into both nostrils 4 (four) times daily. 09/22/15   Rodolph Bong, MD   naproxen (NAPROSYN) 500 MG tablet Take 1 tablet (500 mg total) by mouth 2 (two) times daily with a meal. 09/28/15   Linna Hoff, MD  ondansetron (ZOFRAN ODT) 4 MG disintegrating tablet 4mg  ODT q4 hours prn nausea/vomit 07/06/15   Loren Racer, MD  predniSONE (DELTASONE) 10 MG tablet Take 3 tablets (30 mg total) by mouth daily. 09/22/15   Rodolph Bong, MD  traMADol (ULTRAM) 50 MG tablet Take 1 tablet (50 mg total) by mouth every 6 (six) hours as needed for moderate pain or severe pain. 07/06/15   Loren Racer, MD   Meds Ordered and Administered this Visit  Medications - No data to display  BP 119/84 mmHg  Pulse 69  Temp(Src) 97.9 F (36.6 C) (Oral)  Resp 16  SpO2 100%  LMP 09/01/2015 No data found.   Physical Exam  Constitutional: She is oriented to person, place, and time. She appears well-developed and well-nourished. No distress.  Abdominal: Soft. Bowel sounds are normal.  Musculoskeletal: She exhibits tenderness.       Right hip: She exhibits decreased range of motion, tenderness and bony tenderness. She exhibits no swelling, no crepitus and no deformity.  Neurological: She is alert and oriented to person, place, and time.  Skin: Skin is warm and dry.  Nursing note and vitals reviewed.   ED Course  Procedures (including critical care time)  Labs Review Labs Reviewed - No data to display  Imaging Review Dg Hip Unilat With Pelvis 2-3  Views Right  09/28/2015  CLINICAL DATA:  35 year old female with right hip pain EXAM: DG HIP (WITH OR WITHOUT PELVIS) 2-3V RIGHT COMPARISON:  None. FINDINGS: There is no evidence of hip fracture or dislocation. There is no evidence of arthropathy or other focal bone abnormality. IMPRESSION: Negative. Electronically Signed   By: Elgie CollardArash  Radparvar M.D.   On: 09/28/2015 21:26   X-rays reviewed and report per radiologist.   Visual Acuity Review  Right Eye Distance:   Left Eye Distance:   Bilateral Distance:    Right Eye Near:   Left Eye  Near:    Bilateral Near:         MDM   1. Bursitis, hip, right   2. Pain aggravated by walking        Linna HoffJames D Ebonee Stober, MD 09/29/15 2100

## 2015-11-21 ENCOUNTER — Ambulatory Visit (INDEPENDENT_AMBULATORY_CARE_PROVIDER_SITE_OTHER): Payer: Commercial Managed Care - HMO

## 2015-11-21 ENCOUNTER — Ambulatory Visit (INDEPENDENT_AMBULATORY_CARE_PROVIDER_SITE_OTHER): Payer: Commercial Managed Care - HMO | Admitting: Family Medicine

## 2015-11-21 ENCOUNTER — Encounter: Payer: Self-pay | Admitting: Family Medicine

## 2015-11-21 VITALS — BP 119/72 | HR 85 | Wt 205.0 lb

## 2015-11-21 DIAGNOSIS — M549 Dorsalgia, unspecified: Secondary | ICD-10-CM

## 2015-11-21 DIAGNOSIS — S29012A Strain of muscle and tendon of back wall of thorax, initial encounter: Secondary | ICD-10-CM | POA: Insufficient documentation

## 2015-11-21 MED ORDER — TRAMADOL HCL 50 MG PO TABS: 50.0000 mg | ORAL_TABLET | Freq: Three times a day (TID) | ORAL | Status: DC | PRN

## 2015-11-21 MED ORDER — CYCLOBENZAPRINE HCL 10 MG PO TABS: 10.0000 mg | ORAL_TABLET | Freq: Three times a day (TID) | ORAL | Status: DC | PRN

## 2015-11-21 NOTE — Assessment & Plan Note (Signed)
I believe the patient's pain is due to strain of one of the muscles of her thoracic back or intercostal muscle strain. Plan for referral to physical therapy Flexeril tramadol and naproxen. Recommend GI prophylaxis with naproxen. Recheck In a week or 2 if not better.

## 2015-11-21 NOTE — Patient Instructions (Signed)
Thank you for coming in today. Get xray today.  Attend Physical Therapy.  Take tramadol for severe pain.  Use naproxen along with over the counter prilosec for pain.  Use flexeril as needed.   TENS UNIT: This is helpful for muscle pain and spasm.   Search and Purchase a TENS 7000 2nd edition at www.tenspros.com. Or Amazon.com It should be less than $30.     TENS unit instructions: Do not shower or bathe with the unit on Turn the unit off before removing electrodes or batteries If the electrodes lose stickiness add a drop of water to the electrodes after they are disconnected from the unit and place on plastic sheet. If you continued to have difficulty, call the TENS unit company to purchase more electrodes. Do not apply lotion on the skin area prior to use. Make sure the skin is clean and dry as this will help prolong the life of the electrodes. After use, always check skin for unusual red areas, rash or other skin difficulties. If there are any skin problems, does not apply electrodes to the same area. Never remove the electrodes from the unit by pulling the wires. Do not use the TENS unit or electrodes other than as directed. Do not change electrode placement without consultating your therapist or physician. Keep 2 fingers with between each electrode. Wear time ratio is 2:1, on to off times.    For example on for 30 minutes off for 15 minutes and then on for 30 minutes off for 15 minutes

## 2015-11-21 NOTE — Progress Notes (Signed)
       Cathy Lester is a 35 y.o. female [facility] for left thoracic back pain. Patient is a 1-1/2 week history of left lateral thoracic back and rib pain. She denies any injury. She denies any pain with breathing or pain with deep inspiration or trouble breathing. No cough or congestion. Pain is worse with arm motion and twisting. She's tried some ibuprofen which has helped a bit. No fevers or chills nausea vomiting or diarrhea.   Past Medical History  Diagnosis Date  . Colitis    No past surgical history on file. Social History  Substance Use Topics  . Smoking status: Current Every Day Smoker -- 0.50 packs/day    Types: Cigarettes  . Smokeless tobacco: Not on file  . Alcohol Use: No   family history is not on file.  ROS as above Medications: Current Outpatient Prescriptions  Medication Sig Dispense Refill  . naproxen (NAPROSYN) 500 MG tablet Take 1 tablet (500 mg total) by mouth 2 (two) times daily with a meal. 30 tablet 0  . cyclobenzaprine (FLEXERIL) 10 MG tablet Take 1 tablet (10 mg total) by mouth 3 (three) times daily as needed for muscle spasms. 30 tablet 0  . traMADol (ULTRAM) 50 MG tablet Take 1 tablet (50 mg total) by mouth every 8 (eight) hours as needed. 15 tablet 0   No current facility-administered medications for this visit.   Allergies  Allergen Reactions  . Morphine And Related Nausea And Vomiting     Exam:  BP 119/72 mmHg  Pulse 85  Wt 205 lb (92.987 kg)  LMP 11/02/2015 Gen: Well NAD HEENT: EOMI,  MMM Lungs: Normal work of breathing. CTABL Heart: RRR no MRG Abd: NABS, Soft. Nondistended, Nontender Exts: Brisk capillary refill, warm and well perfused.  Left thoracic back is tender to palpation along the inferior lateral portion. Pain with arm motion present. No scapular winging present.\  Chest x-ray pending  No results found  for this or any previous visit (from the past 24 hour(s)). No results found.   Please see individual assessment and plan sections.

## 2015-11-22 NOTE — Progress Notes (Signed)
Quick Note:  Xray normal. ______ 

## 2015-12-06 ENCOUNTER — Encounter: Payer: Self-pay | Admitting: Family Medicine

## 2015-12-06 ENCOUNTER — Ambulatory Visit (INDEPENDENT_AMBULATORY_CARE_PROVIDER_SITE_OTHER): Payer: Commercial Managed Care - HMO | Admitting: Family Medicine

## 2015-12-06 VITALS — BP 131/86 | HR 106 | Wt 203.0 lb

## 2015-12-06 DIAGNOSIS — S29012A Strain of muscle and tendon of back wall of thorax, initial encounter: Secondary | ICD-10-CM

## 2015-12-06 MED ORDER — NAPROXEN 500 MG PO TABS: 500.0000 mg | ORAL_TABLET | Freq: Two times a day (BID) | ORAL | Status: DC

## 2015-12-06 MED ORDER — TRAMADOL HCL 50 MG PO TABS: 50.0000 mg | ORAL_TABLET | Freq: Three times a day (TID) | ORAL | Status: DC | PRN

## 2015-12-06 MED ORDER — CYCLOBENZAPRINE HCL 10 MG PO TABS: 10.0000 mg | ORAL_TABLET | Freq: Three times a day (TID) | ORAL | Status: DC | PRN

## 2015-12-06 NOTE — Progress Notes (Signed)
       Cathy Lester is a 35 y.o. female who [name] today for follow-up back pain. Patient was seen on March 6th for left-sided thoracic back pain. She's used some of the medicines that were prescribed for pain which helped a little. She has not yet gone to physical therapy. The pain persists and is worse with activity and better with rest. No trouble breathing.   Past Medical History  Diagnosis Date  . Colitis    No past surgical history on file. Social History  Substance Use Topics  . Smoking status: Current Every Day Smoker -- 0.50 packs/day    Types: Cigarettes  . Smokeless tobacco: Not on file  . Alcohol Use: No   family history is not on file.  ROS as above Medications: Current Outpatient Prescriptions  Medication Sig Dispense Refill  . cyclobenzaprine (FLEXERIL) 10 MG tablet Take 1 tablet (10 mg total) by mouth 3 (three) times daily as needed for muscle spasms. 30 tablet 0  . naproxen (NAPROSYN) 500 MG tablet Take 1 tablet (500 mg total) by mouth 2 (two) times daily with a meal. 30 tablet 0  . traMADol (ULTRAM) 50 MG tablet Take 1 tablet (50 mg total) by mouth every 8 (eight) hours as needed. 15 tablet 0   No current facility-administered medications for this visit.   Allergies  Allergen Reactions  . Morphine And Related Nausea And Vomiting     Exam:  BP 131/86 mmHg  Pulse 106  Wt 203 lb (92.08 kg)  LMP 11/02/2015 Gen: Well NAD HEENT: EOMI,  MMM Lungs: Normal work of breathing. CTABL Heart: RRR no MRG Abd: NABS, Soft. Nondistended, Nontender Exts: Brisk capillary refill, warm and well perfused.  Left thoracic back is tender to palpation along the inferior lateral portion. Pain with arm motion present. No scapular winging present  No results found for this or any previous visit (from the past 24 hour(s)). No results found.   Please see  individual assessment and plan sections.

## 2015-12-06 NOTE — Assessment & Plan Note (Signed)
Refer to physical therapy. Recheck in a few weeks.

## 2015-12-06 NOTE — Patient Instructions (Signed)
Thank you for coming in today. Go to PT.  Take the medicines as needed.  Take omeprazole if taking naproxen.  Return in a few weeks.  Come back or go to the emergency room if you notice new weakness new numbness problems walking or bowel or bladder problems.  TENS UNIT: This is helpful for muscle pain and spasm.   Search and Purchase a TENS 7000 2nd edition at www.tenspros.com. It should be less than $30.     TENS unit instructions: Do not shower or bathe with the unit on Turn the unit off before removing electrodes or batteries If the electrodes lose stickiness add a drop of water to the electrodes after they are disconnected from the unit and place on plastic sheet. If you continued to have difficulty, call the TENS unit company to purchase more electrodes. Do not apply lotion on the skin area prior to use. Make sure the skin is clean and dry as this will help prolong the life of the electrodes. After use, always check skin for unusual red areas, rash or other skin difficulties. If there are any skin problems, does not apply electrodes to the same area. Never remove the electrodes from the unit by pulling the wires. Do not use the TENS unit or electrodes other than as directed. Do not change electrode placement without consultating your therapist or physician. Keep 2 fingers with between each electrode. Wear time ratio is 2:1, on to off times.    For example on for 30 minutes off for 15 minutes and then on for 30 minutes off for 15 minutes

## 2015-12-13 ENCOUNTER — Ambulatory Visit: Payer: Commercial Managed Care - HMO | Admitting: Physical Therapy

## 2015-12-23 ENCOUNTER — Encounter: Payer: Self-pay | Admitting: Rehabilitative and Restorative Service Providers"

## 2015-12-23 ENCOUNTER — Ambulatory Visit (INDEPENDENT_AMBULATORY_CARE_PROVIDER_SITE_OTHER): Payer: Commercial Managed Care - HMO | Admitting: Rehabilitative and Restorative Service Providers"

## 2015-12-23 DIAGNOSIS — M791 Myalgia, unspecified site: Secondary | ICD-10-CM

## 2015-12-23 DIAGNOSIS — R29898 Other symptoms and signs involving the musculoskeletal system: Secondary | ICD-10-CM | POA: Diagnosis not present

## 2015-12-23 NOTE — Therapy (Addendum)
[place], [place], [address] Phone: [phone]   Fax:  [phone]  Physical Therapy Evaluation  Patient Details  Name: Cathy Lester MRN: [id number] Date of Birth: [birth year] Referring Provider: Dr Lynne Leader  Encounter Date: 12/23/2015      PT End of Session - 12/23/15 1507    Visit Number 1   Number of Visits 12   Date for PT Re-Evaluation 02/03/16   PT Start Time 1346   PT Stop Time 2992   PT Time Calculation (min) 67 min   Activity Tolerance Patient tolerated treatment well;Patient limited by pain      Past Medical History  Diagnosis Date  . Colitis     History reviewed. No pertinent past surgical history.  There were no vitals filed for this visit.       Subjective Assessment - 12/23/15 1402    Subjective Patient reports that hse is having pain on the Lt side of her trunk for the past 2 months. There is no known injury. She has been treated with medication with no improvement.    Pertinent History Denies medical problems    How long can you sit comfortably? 30 min    How long can you stand comfortably? 10-20 min    How long can you walk comfortably? 5- 10 min    Diagnostic tests xray (-)    Currently in Pain? Yes   Pain Score 10-Worst pain ever   Pain Location Thoracic   Pain Orientation Left   Pain Descriptors / Indicators Sharp;Stabbing   Pain Type Acute pain   Pain Onset More than a month ago   Pain Frequency Constant   Aggravating Factors  reachiing; stretching; standing; walking; lifting; lying down   Pain Relieving Factors meds; icy hot help "a little bit"             [facility] PT Assessment - 12/23/15 0001    Assessment   Medical Diagnosis strain of mid back   Referring Provider Dr Lynne Leader   Onset Date/Surgical Date 11/12/15   Hand Dominance Right   Next MD Visit no return scheduled   Prior Therapy none   Precautions   Precautions None   Balance Screen    Has the patient fallen in the past 6 months No   Has the patient had a decrease in activity level because of a fear of falling?  No   Is the patient reluctant to leave their home because of a fear of falling?  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 Stands with weight shifted slightly to the Rt; slightly forward flexed hips; rounded shoulders; feet in some ER    AROM   Overall AROM Comments pain with all trunk movements    Lumbar Flexion 60%  10/10 pain    Lumbar Extension 40%   Lumbar - Right Side Bend 70%  Lumbar - Left Side Bend 65%   Lumbar - Right Rotation 30%   Lumbar - Left Rotation 10%   Strength   Overall Strength Comments 5/5 bilat LE's with some c/o of pain    Flexibility   Hamstrings tight bilat ~ 70 deg    Quadriceps tightn end range   ITB tight bilat   Piriformis tight Rt    Palpation   Spinal mobility tenderness with PA mobs ~ T7/8 to T10/11 greatest at T7 and T 10 - pain with CPA and Lt UPA mobs    SI assessment  WFL's   Palpation comment tendeness through the Lt posteriorlateral thoracic region into the lats on Lt. some discomfort with rib springing posteriorly minimally anteriorly and only mild discomfort with rib testing through the floating ribs and sternum. No change in symptoms with deep breathing. no boney abnormality noted wit hpalpation through the ribs and no abnormalities noted on xray 3/17                    [facility] Adult PT Treatment/Exercise - 12/23/15 0001    Self-Care   Self-Care --  education re spine care/postures/positions at work    Neuro Re-ed    Neuro Re-ed Details  education re posture and alignment in sitting and standing    Lumbar Exercises: Supine   Other Supine Lumbar Exercises supine latismuss stretch 30-45 sec x 3    Moist Heat Therapy   Number Minutes Moist Heat 20 Minutes   Moist Heat Location --  Lt posteriorlateral thoracic    Electrical Stimulation   Electrical Stimulation Location Lt posterior thoracic spine    Electrical Stimulation Action IFC   Electrical Stimulation Parameters to tolerance   Electrical Stimulation Goals Pain                PT Education - 12/23/15 1448    Education provided Yes   Education Details HEP; postural correction    Person(s) Educated Patient   Methods Explanation;Demonstration;Tactile cues;Verbal cues;Handout   Comprehension Verbalized understanding;Returned demonstration;Verbal cues required;Tactile cues required             PT Long Term Goals - 12/23/15 1515    PT LONG TERM GOAL #1   Title Improve posture and alingnment with patient to demo upright midline posture in sitting 02/03/16   Time 6   Period Weeks   Status New   PT LONG TERM GOAL #2   Title Improve trunk mobility to 65-75% throughout with painfree motion 5?19/17   Time 6   Period Weeks   Status New   PT LONG TERM GOAL #3   Title Patient to tolerate standing without discomfort for 1-2 hours 02/03/16   Time 6   Period Weeks   Status New   PT LONG TERM GOAL #4   Title I in HEP 02/03/16   Time 6   Period Weeks   Status New   PT LONG TERM GOAL #5   Title Improve FOTO to </=38% limitation 02/03/16   Time 6   Period Weeks   Status New               Plan - 12/23/15 1508    Clinical Impression Statement Patient presents with c/o Lt thoracic pain of ~ 2 months duration with no known cause of injury. She has limited  trunk mobility; pain with spring testing through Lt ribs; tenderness and pain with thoracic spine mobs T7 to T10 area; pain and limited functional  activity level. She has tightness through the Lt latissmus and  ribs. Patient will benefit form trial of PT to address muscular tightness; poor posture and alignment; poor body mechanics.    Rehab Potential Good   PT Frequency 2x / week   PT Duration 6 weeks   PT Treatment/Interventions Patient/family education;Neuromuscular re-education;ADLs/Self Care Home Management;Manual techniques;Dry needling;Cryotherapy;Electrical Stimulation;Iontophoresis 62m/ml Dexamethasone;Moist Heat;Ultrasound;Therapeutic activities;Therapeutic exercise   PT Next Visit Plan assess response to initial treatment; progress with gentle trunk stretching and core stabilization; further assessment of thoracic and rib dysfunction as indicated   PT Home Exercise Plan HEP; TENs info: spine care information    Consulted and Agree with Plan of Care Patient      Patient will benefit from skilled therapeutic intervention in order to improve the following deficits and impairments:  Postural dysfunction, Improper body mechanics, Pain, Increased fascial restricitons, Decreased range of motion, Decreased mobility, Decreased activity tolerance, Decreased endurance  Visit Diagnosis: Other symptoms and signs involving the musculoskeletal system - Plan: PT plan of care cert/re-cert  Myalgia - Plan: PT plan of care cert/re-cert     Problem List Patient Active Problem List   Diagnosis Date Noted  . Strain of mid-back 11/21/2015  . Upper respiratory infection 09/22/2015  . Colitis 07/22/2015    Azaleah Usman PNilda SimmerPT, MPH  12/23/2015, 3:21 PM  [facility] [address]Phone: [phone]  Fax:  [phone] Name: SDebroah ShuttleworthMRN: [id number] of Birth: 9November 09, 1982  PHYSICAL THERAPY DISCHARGE  SUMMARY  Visits from Start of Care: Evaluation only  Current functional level related to goals / functional outcomes: unchanged   Remaining deficits: unchanged   Education / Equipment: HEP Plan: Patient agrees to discharge.  Patient goals were not met. Patient is being discharged due to not returning since the last visit.  ?????     Anais Denslow P. HHelene KelpPT, MPH 02/29/2016 1:10 PM

## 2015-12-23 NOTE — Patient Instructions (Signed)
Supine Knee-to-Chest, Bilateral    Lie on back, hands clasped behind both knees. Pull knees in toward chest until a comfortable stretch is felt in lower back and buttocks. Turn hands toward face and reach up overhead. Hold _30-60__ seconds. Repeat _3__ times per session. Do __3-5_ sessions per day.    Sitting    Sit in chair with knees spread apart. Bend forward toward floor. Comfortable stretch should be felt in lower back. Hold __20-30_ seconds. Repeat _1-2__ times per session. Do _2-3__ sessions per day.  TENS UNIT: This is helpful for muscle pain and spasm.   Search and Purchase a TENS 7000 2nd edition at www.tenspros.com. It should be less than $30.     TENS unit instructions: Do not shower or bathe with the unit on Turn the unit off before removing electrodes or batteries If the electrodes lose stickiness add a drop of water to the electrodes after they are disconnected from the unit and place on plastic sheet. If you continued to have difficulty, call the TENS unit company to purchase more electrodes. Do not apply lotion on the skin area prior to use. Make sure the skin is clean and dry as this will help prolong the life of the electrodes. After use, always check skin for unusual red areas, rash or other skin difficulties. If there are any skin problems, does not apply electrodes to the same area. Never remove the electrodes from the unit by pulling the wires. Do not use the TENS unit or electrodes other than as directed. Do not change electrode placement without consultating your therapist or physician. Keep 2 fingers with between each electrode.    Sleeping on Back  Place pillow under knees. A pillow with cervical support and a roll around waist are also helpful. Copyright  VHI. All rights reserved.  Sleeping on Side Place pillow between knees. Use cervical support under neck and a roll around waist as needed. Copyright  VHI. All rights reserved.   Sleeping on  Stomach   If this is the only desirable sleeping position, place pillow under lower legs, and under stomach or chest as needed.  Posture - Sitting   Sit upright, head facing forward. Try using a roll to support lower back. Keep shoulders relaxed, and avoid rounded back. Keep hips level with knees. Avoid crossing legs for long periods. Stand to Sit / Sit to Stand   To sit: Bend knees to lower self onto front edge of chair, then scoot back on seat. To stand: Reverse sequence by placing one foot forward, and scoot to front of seat. Use rocking motion to stand up.   Work Height and Reach  Ideal work height is no more than 2 to 4 inches below elbow level when standing, and at elbow level when sitting. Reaching should be limited to arm's length, with elbows slightly bent.  Bending  Bend at hips and knees, not back. Keep feet shoulder-width apart.    Posture - Standing   Good posture is important. Avoid slouching and forward head thrust. Maintain curve in low back and align ears over shoul- ders, hips over ankles.  Alternating Positions   Alternate tasks and change positions frequently to reduce fatigue and muscle tension. Take rest breaks. Computer Work   Position work to face forward. Use proper work and seat height. Keep shoulders back and down, wrists straight, and elbows at right angles. Use chair that provides full back support. Add footrest and lumbar roll as needed.  Getting Into /   Out of Car  Lower self onto seat, scoot back, then bring in one leg at a time. Reverse sequence to get out.  Dressing  Lie on back to pull socks or slacks over feet, or sit and bend leg while keeping back straight.    Housework - Sink  Place one foot on ledge of cabinet under sink when standing at sink for prolonged periods.   Pushing / Pulling  Pushing is preferable to pulling. Keep back in proper alignment, and use leg muscles to do the work.  Deep Squat   Squat and lift with  both arms held against upper trunk. Tighten stomach muscles without holding breath. Use smooth movements to avoid jerking.  Avoid Twisting   Avoid twisting or bending back. Pivot around using foot movements, and bend at knees if needed when reaching for articles.  Carrying Luggage   Distribute weight evenly on both sides. Use a cart whenever possible. Do not twist trunk. Move body as a unit.   Lifting Principles .Maintain proper posture and head alignment. .Slide object as close as possible before lifting. .Move obstacles out of the way. .Test before lifting; ask for help if too heavy. .Tighten stomach muscles without holding breath. .Use smooth movements; do not jerk. .Use legs to do the work, and pivot with feet. .Distribute the work load symmetrically and close to the center of trunk. .Push instead of pull whenever possible.   Ask For Help   Ask for help and delegate to others when possible. Coordinate your movements when lifting together, and maintain the low back curve.  Log Roll   Lying on back, bend left knee and place left arm across chest. Roll all in one movement to the right. Reverse to roll to the left. Always move as one unit. Housework - Sweeping  Use long-handled equipment to avoid stooping.   Housework - Wiping  Position yourself as close as possible to reach work surface. Avoid straining your back.  Laundry - Unloading Wash   To unload small items at bottom of washer, lift leg opposite to arm being used to reach.  Gardening - Raking  Move close to area to be raked. Use arm movements to do the work. Keep back straight and avoid twisting.     Cart  When reaching into cart with one arm, lift opposite leg to keep back straight.   Getting Into / Out of Bed  Lower self to lie down on one side by raising legs and lowering head at the same time. Use arms to assist moving without twisting. Bend both knees to roll onto back if desired. To sit up, start  from lying on side, and use same move-ments in reverse. Housework - Vacuuming  Hold the vacuum with arm held at side. Step back and forth to move it, keeping head up. Avoid twisting.   Laundry - Loading Wash  Position laundry basket so that bending and twisting can be avoided.   Laundry - Unloading Dryer  Squat down to reach into clothes dryer or use a reacher.  Gardening - Weeding / Planting  Squat or Kneel. Knee pads may be helpful.                     

## 2015-12-23 NOTE — Patient Instructions (Signed)
Supine Knee-to-Chest, Bilateral    Lie on back, hands clasped behind both knees. Pull knees in toward chest until a comfortable stretch is felt in lower back and buttocks. Turn hands toward face and reach up overhead. Hold _30-60__ seconds. Repeat _3__ times per session. Do __3-5_ sessions per day.    Sitting    Sit in chair with knees spread apart. Bend forward toward floor. Comfortable stretch should be felt in lower back. Hold __20-30_ seconds. Repeat _1-2__ times per session. Do _2-3__ sessions per day.  TENS UNIT: This is helpful for muscle pain and spasm.   Search and Purchase a TENS 7000 2nd edition at www.tenspros.com. It should be less than $30.     TENS unit instructions: Do not shower or bathe with the unit on Turn the unit off before removing electrodes or batteries If the electrodes lose stickiness add a drop of water to the electrodes after they are disconnected from the unit and place on plastic sheet. If you continued to have difficulty, call the TENS unit company to purchase more electrodes. Do not apply lotion on the skin area prior to use. Make sure the skin is clean and dry as this will help prolong the life of the electrodes. After use, always check skin for unusual red areas, rash or other skin difficulties. If there are any skin problems, does not apply electrodes to the same area. Never remove the electrodes from the unit by pulling the wires. Do not use the TENS unit or electrodes other than as directed. Do not change electrode placement without consultating your therapist or physician. Keep 2 fingers with between each electrode.    Sleeping on Back  Place pillow under knees. A pillow with cervical support and a roll around waist are also helpful. Copyright  VHI. All rights reserved.  Sleeping on Side Place pillow between knees. Use cervical support under neck and a roll around waist as needed. Copyright  VHI. All rights reserved.   Sleeping on  Stomach   If this is the only desirable sleeping position, place pillow under lower legs, and under stomach or chest as needed.  Posture - Sitting   Sit upright, head facing forward. Try using a roll to support lower back. Keep shoulders relaxed, and avoid rounded back. Keep hips level with knees. Avoid crossing legs for long periods. Stand to Sit / Sit to Stand   To sit: Bend knees to lower self onto front edge of chair, then scoot back on seat. To stand: Reverse sequence by placing one foot forward, and scoot to front of seat. Use rocking motion to stand up.   Work Height and Reach  Ideal work height is no more than 2 to 4 inches below elbow level when standing, and at elbow level when sitting. Reaching should be limited to arm's length, with elbows slightly bent.  Bending  Bend at hips and knees, not back. Keep feet shoulder-width apart.    Posture - Standing   Good posture is important. Avoid slouching and forward head thrust. Maintain curve in low back and align ears over shoul- ders, hips over ankles.  Alternating Positions   Alternate tasks and change positions frequently to reduce fatigue and muscle tension. Take rest breaks. Computer Work   Position work to Art gallery manager. Use proper work and seat height. Keep shoulders back and down, wrists straight, and elbows at right angles. Use chair that provides full back support. Add footrest and lumbar roll as needed.  Getting Into /  Out of Car  Lower self onto seat, scoot back, then bring in one leg at a time. Reverse sequence to get out.  Dressing  Lie on back to pull socks or slacks over feet, or sit and bend leg while keeping back straight.    Housework - Sink  Place one foot on ledge of cabinet under sink when standing at sink for prolonged periods.   Pushing / Pulling  Pushing is preferable to pulling. Keep back in proper alignment, and use leg muscles to do the work.  Deep Squat   Squat and lift with  both arms held against upper trunk. Tighten stomach muscles without holding breath. Use smooth movements to avoid jerking.  Avoid Twisting   Avoid twisting or bending back. Pivot around using foot movements, and bend at knees if needed when reaching for articles.  Carrying Luggage   Distribute weight evenly on both sides. Use a cart whenever possible. Do not twist trunk. Move body as a unit.   Lifting Principles .Maintain proper posture and head alignment. .Slide object as close as possible before lifting. .Move obstacles out of the way. .Test before lifting; ask for help if too heavy. .Tighten stomach muscles without holding breath. .Use smooth movements; do not jerk. .Use legs to do the work, and pivot with feet. .Distribute the work load symmetrically and close to the center of trunk. .Push instead of pull whenever possible.   Ask For Help   Ask for help and delegate to others when possible. Coordinate your movements when lifting together, and maintain the low back curve.  Log Roll   Lying on back, bend left knee and place left arm across chest. Roll all in one movement to the right. Reverse to roll to the left. Always move as one unit. Housework - Sweeping  Use long-handled equipment to avoid stooping.   Housework - Wiping  Position yourself as close as possible to reach work surface. Avoid straining your back.  Laundry - Unloading Wash   To unload small items at bottom of washer, lift leg opposite to arm being used to reach.  Gardening - Raking  Move close to area to be raked. Use arm movements to do the work. Keep back straight and avoid twisting.     Cart  When reaching into cart with one arm, lift opposite leg to keep back straight.   Getting Into / Out of Bed  Lower self to lie down on one side by raising legs and lowering head at the same time. Use arms to assist moving without twisting. Bend both knees to roll onto back if desired. To sit up, start  from lying on side, and use same move-ments in reverse. Housework - Vacuuming  Hold the vacuum with arm held at side. Step back and forth to move it, keeping head up. Avoid twisting.   Laundry - Armed forces training and education officerLoading Wash  Position laundry basket so that bending and twisting can be avoided.   Laundry - Unloading Dryer  Squat down to reach into clothes dryer or use a reacher.  Gardening - Weeding / Psychiatric nurselanting  Squat or Kneel. Knee pads may be helpful.

## 2015-12-26 ENCOUNTER — Emergency Department (HOSPITAL_COMMUNITY)
Admission: EM | Admit: 2015-12-26 | Discharge: 2015-12-26 | Disposition: A | Discharge status: Home / Self Care | Payer: Commercial Managed Care - HMO | Attending: Emergency Medicine | Admitting: Emergency Medicine

## 2015-12-26 ENCOUNTER — Encounter (HOSPITAL_COMMUNITY): Payer: Self-pay | Admitting: *Deleted

## 2015-12-26 ENCOUNTER — Emergency Department (HOSPITAL_COMMUNITY): Payer: Commercial Managed Care - HMO

## 2015-12-26 DIAGNOSIS — Z3202 Encounter for pregnancy test, result negative: Secondary | ICD-10-CM | POA: Insufficient documentation

## 2015-12-26 DIAGNOSIS — F17210 Nicotine dependence, cigarettes, uncomplicated: Secondary | ICD-10-CM | POA: Insufficient documentation

## 2015-12-26 DIAGNOSIS — M549 Dorsalgia, unspecified: Secondary | ICD-10-CM | POA: Diagnosis not present

## 2015-12-26 DIAGNOSIS — R1012 Left upper quadrant pain: Secondary | ICD-10-CM | POA: Insufficient documentation

## 2015-12-26 DIAGNOSIS — Z791 Long term (current) use of non-steroidal anti-inflammatories (NSAID): Secondary | ICD-10-CM | POA: Diagnosis not present

## 2015-12-26 DIAGNOSIS — Z8719 Personal history of other diseases of the digestive system: Secondary | ICD-10-CM | POA: Insufficient documentation

## 2015-12-26 LAB — COMPREHENSIVE METABOLIC PANEL
ALT: 29 U/L (ref 14–54)
ANION GAP: 8 (ref 5–15)
AST: 31 U/L (ref 15–41)
Albumin: 4.2 g/dL (ref 3.5–5.0)
Alkaline Phosphatase: 42 U/L (ref 38–126)
BUN: 11 mg/dL (ref 6–20)
CHLORIDE: 108 mmol/L (ref 101–111)
CO2: 24 mmol/L (ref 22–32)
Calcium: 9.4 mg/dL (ref 8.9–10.3)
Creatinine, Ser: 0.72 mg/dL (ref 0.44–1.00)
GFR calc Af Amer: >60 mL/min (ref >60)
Glucose, Bld: 95 mg/dL (ref 65–99)
POTASSIUM: 3.8 mmol/L (ref 3.5–5.1)
Sodium: 140 mmol/L (ref 135–145)
Total Bilirubin: 0.5 mg/dL (ref 0.3–1.2)
Total Protein: 7.5 g/dL (ref 6.5–8.1)

## 2015-12-26 LAB — URINALYSIS, ROUTINE W REFLEX MICROSCOPIC
Bilirubin Urine: NEGATIVE
GLUCOSE, UA: NEGATIVE mg/dL
Hgb urine dipstick: NEGATIVE
Ketones, ur: 15 mg/dL — AB
LEUKOCYTES UA: NEGATIVE
NITRITE: NEGATIVE
PH: 6 (ref 5.0–8.0)
Protein, ur: NEGATIVE mg/dL
SPECIFIC GRAVITY, URINE: 1.034 — AB (ref 1.005–1.030)

## 2015-12-26 LAB — I-STAT BETA HCG BLOOD, ED (MC, WL, AP ONLY): I-stat hCG, quantitative: <5 m[IU]/mL (ref <5)

## 2015-12-26 LAB — CBC
HEMATOCRIT: 38.9 % (ref 36.0–46.0)
HEMOGLOBIN: 12.9 g/dL (ref 12.0–15.0)
MCH: 28.5 pg (ref 26.0–34.0)
MCHC: 33.2 g/dL (ref 30.0–36.0)
MCV: 86.1 fL (ref 78.0–100.0)
Platelets: 271 10*3/uL (ref 150–400)
RBC: 4.52 MIL/uL (ref 3.87–5.11)
RDW: 13.3 % (ref 11.5–15.5)
WBC: 6.2 10*3/uL (ref 4.0–10.5)

## 2015-12-26 LAB — LIPASE, BLOOD: LIPASE: 22 U/L (ref 11–51)

## 2015-12-26 MED ORDER — DIPHENHYDRAMINE HCL 25 MG PO CAPS
ORAL_CAPSULE | ORAL | Status: AC
  Filled 2015-12-26: qty 2

## 2015-12-26 MED ORDER — OXYCODONE-ACETAMINOPHEN 5-325 MG PO TABS
1.0000 | ORAL_TABLET | ORAL | Status: DC | PRN
  Administered 2015-12-26: 1 via ORAL

## 2015-12-26 MED ORDER — FENTANYL CITRATE (PF) 100 MCG/2ML IJ SOLN
100.0000 ug | Freq: Once | INTRAMUSCULAR | Status: AC
  Administered 2015-12-26: 100 ug via INTRAVENOUS
  Filled 2015-12-26: qty 2

## 2015-12-26 MED ORDER — SODIUM CHLORIDE 0.9 % IV BOLUS (SEPSIS)
1000.0000 mL | Freq: Once | INTRAVENOUS | Status: AC
  Administered 2015-12-26: 1000 mL via INTRAVENOUS

## 2015-12-26 MED ORDER — DIPHENHYDRAMINE HCL 25 MG PO CAPS
50.0000 mg | ORAL_CAPSULE | Freq: Once | ORAL | Status: AC
  Administered 2015-12-26: 50 mg via ORAL

## 2015-12-26 MED ORDER — KETOROLAC TROMETHAMINE 30 MG/ML IJ SOLN
30.0000 mg | Freq: Once | INTRAMUSCULAR | Status: AC
  Administered 2015-12-26: 30 mg via INTRAVENOUS
  Filled 2015-12-26: qty 1

## 2015-12-26 MED ORDER — OXYCODONE-ACETAMINOPHEN 5-325 MG PO TABS
ORAL_TABLET | ORAL | Status: AC
  Filled 2015-12-26: qty 1

## 2015-12-26 MED ORDER — ONDANSETRON HCL 4 MG/2ML IJ SOLN
4.0000 mg | Freq: Once | INTRAMUSCULAR | Status: AC
  Administered 2015-12-26: 4 mg via INTRAVENOUS
  Filled 2015-12-26: qty 2

## 2015-12-26 MED ORDER — GI COCKTAIL ~~LOC~~
30.0000 mL | Freq: Once | ORAL | Status: AC
  Administered 2015-12-26: 30 mL via ORAL
  Filled 2015-12-26: qty 30

## 2015-12-26 NOTE — ED Notes (Signed)
Pt reports generalized back pain that has been ongoing, has been to dr for it and is in PT for it, but no relief of pain. Also reports abd pain that has gotten worse since yesterday. Pt points to left side of abd and flank area. Denies urinary symptoms. Denies vomiting or diarrhea.

## 2015-12-26 NOTE — Discharge Instructions (Signed)
Try zantac 150mg twice a day.   °Abdominal Pain, Adult °Many things can cause abdominal pain. Usually, abdominal pain is not caused by a disease and will improve without treatment. It can often be observed and treated at home. Your health care provider will do a physical exam and possibly order blood tests and X-rays to help determine the seriousness of your pain. However, in many cases, more time must pass before a clear cause of the pain can be found. Before that point, your health care provider may not know if you need more testing or further treatment. °HOME CARE INSTRUCTIONS °Monitor your abdominal pain for any changes. The following actions may help to alleviate any discomfort you are experiencing: °· Only take over-the-counter or prescription medicines as directed by your health care provider. °· Do not take laxatives unless directed to do so by your health care provider. °· Try a clear liquid diet (broth, tea, or water) as directed by your health care provider. Slowly move to a bland diet as tolerated. °SEEK MEDICAL CARE IF: °· You have unexplained abdominal pain. °· You have abdominal pain associated with nausea or diarrhea. °· You have pain when you urinate or have a bowel movement. °· You experience abdominal pain that wakes you in the night. °· You have abdominal pain that is worsened or improved by eating food. °· You have abdominal pain that is worsened with eating fatty foods. °· You have a fever. °SEEK IMMEDIATE MEDICAL CARE IF: °· Your pain does not go away within 2 hours. °· You keep throwing up (vomiting). °· Your pain is felt only in portions of the abdomen, such as the right side or the left lower portion of the abdomen. °· You pass bloody or black tarry stools. °MAKE SURE YOU: °· Understand these instructions. °· Will watch your condition. °· Will get help right away if you are not doing well or get worse. °  °This information is not intended to replace advice given to you by your health care  provider. Make sure you discuss any questions you have with your health care provider. °  °Document Released: 06/13/2005 Document Revised: 05/25/2015 Document Reviewed: 05/13/2013 °Elsevier Interactive Patient Education ©2016 [employer] Inc. ° °

## 2015-12-26 NOTE — ED Notes (Signed)
Pt family sts pt now has hives on face; KP to eval pt

## 2015-12-26 NOTE — ED Notes (Signed)
Pt ambulatory w/ steady gait to restroom. 

## 2015-12-26 NOTE — ED Provider Notes (Signed)
CSN: [id number]     Arrival date & time 12/26/15  1445 History   First MD Initiated Contact with Patient 12/26/15 1840     Chief Complaint  Patient presents with  . Back Pain  . Abdominal Pain     (Consider location/radiation/quality/duration/timing/severity/associated sxs/prior Treatment) Patient is a 35 y.o. female presenting with back pain and abdominal pain. The history is provided by the patient.  Back Pain Associated symptoms: abdominal pain   Associated symptoms: no chest pain, no dysuria, no fever and no headaches   Abdominal Pain Pain location:  L flank Pain quality: cramping, sharp and shooting   Pain radiates to:  Does not radiate Pain severity:  Severe Onset quality:  Gradual Duration:  2 weeks Timing:  Intermittent Progression:  Waxing and waning Chronicity:  Chronic Relieved by:  Nothing Worsened by:  Nothing tried Ineffective treatments:  None tried Associated symptoms: no chest pain, no chills, no dysuria, no fever, no nausea, no shortness of breath and no vomiting    35 yo F With a chief complaints of left upper quadrant abdominal pain. This radiates to her flank comes and goes. Patient has had multiple evaluations in the emergency department for the same type of abdominal pain. She denies any vaginal bleeding vaginal discharge. Denies dysuria. Denies fevers or chills has some nausea but denies vomiting.  Past Medical History  Diagnosis Date  . Colitis    History reviewed. No pertinent past surgical history. History reviewed. No pertinent family history. Social History  Substance Use Topics  . Smoking status: Current Every Day Smoker -- 0.50 packs/day    Types: Cigarettes  . Smokeless tobacco: None  . Alcohol Use: No   OB History    No data available     Review of Systems  Constitutional: Negative for fever and chills.  HENT: Negative for congestion and rhinorrhea.   Eyes: Negative for redness and visual disturbance.  Respiratory: Negative for  shortness of breath and wheezing.   Cardiovascular: Negative for chest pain and palpitations.  Gastrointestinal: Positive for abdominal pain. Negative for nausea and vomiting.  Genitourinary: Negative for dysuria and urgency.  Musculoskeletal: Positive for back pain. Negative for myalgias and arthralgias.  Skin: Negative for pallor and wound.  Neurological: Negative for dizziness and headaches.      Allergies  Morphine and related  Home Medications   Prior to Admission medications   Medication Sig Start Date End Date Taking? Authorizing Provider  cyclobenzaprine (FLEXERIL) 10 MG tablet Take 1 tablet (10 mg total) by mouth 3 (three) times daily as needed for muscle spasms. 12/06/15   Rodolph Bong, MD  naproxen (NAPROSYN) 500 MG tablet Take 1 tablet (500 mg total) by mouth 2 (two) times daily with a meal. 12/06/15   Rodolph Bong, MD  traMADol (ULTRAM) 50 MG tablet Take 1 tablet (50 mg total) by mouth every 8 (eight) hours as needed. 12/06/15   Rodolph Bong, MD   BP 115/72 mmHg  Pulse 57  Temp(Src) 97.8 F (36.6 C) (Oral)  Resp 18  SpO2 100%  LMP 12/26/2015 Physical Exam  Constitutional: She is oriented to person, place, and time. She appears well-developed and well-nourished. No distress.  HENT:  Head: Normocephalic and atraumatic.  Eyes: EOM are normal. Pupils are equal, round, and reactive to light.  Neck: Normal range of motion. Neck supple.  Cardiovascular: Normal rate and regular rhythm.  Exam reveals no gallop and no friction rub.   No murmur heard. Pulmonary/Chest: Effort  normal. She has no wheezes. She has no rales.  Abdominal: Soft. She exhibits no distension. There is no tenderness. There is no rebound.  Musculoskeletal: She exhibits no edema or tenderness.  Neurological: She is alert and oriented to person, place, and time.  Skin: Skin is warm and dry. She is not diaphoretic.  Psychiatric: She has a normal mood and affect. Her behavior is normal.  Nursing note and  vitals reviewed.   ED Course  Procedures (including critical care time) Labs Review Labs Reviewed  URINALYSIS, ROUTINE W REFLEX MICROSCOPIC (NOT AT [facility]) - Abnormal; Notable for the following:    Specific Gravity, Urine 1.034 (*)    Ketones, ur 15 (*)    All other components within normal limits  LIPASE, BLOOD  COMPREHENSIVE METABOLIC PANEL  CBC  I-STAT BETA HCG BLOOD, ED ([facility], [facility], [facility] ONLY)    Imaging Review Ct Renal Stone Study  12/26/2015  CLINICAL DATA:  Acute onset of generalized back pain and abdominal pain. Initial encounter. EXAM: CT ABDOMEN AND PELVIS WITHOUT CONTRAST TECHNIQUE: Multidetector CT imaging of the abdomen and pelvis was performed following the standard protocol without IV contrast. COMPARISON:  CT the abdomen and pelvis from 12/30/2014 FINDINGS: The visualized lung bases are clear. The liver and spleen are unremarkable in appearance. The gallbladder is within normal limits. The pancreas and adrenal glands are unremarkable. The kidneys are unremarkable in appearance. There is no evidence of hydronephrosis. No renal or ureteral stones are seen. No perinephric stranding is appreciated. No free fluid is identified. The small bowel is unremarkable in appearance. The stomach is within normal limits. No acute vascular abnormalities are seen. The appendix is normal in caliber, without evidence of appendicitis. The colon is unremarkable in appearance. The bladder is mildly distended and grossly unremarkable. A tampon is noted at the vagina. The uterus is grossly unremarkable in appearance. The ovaries are relatively symmetric. No inguinal lymphadenopathy is seen. No acute osseous abnormalities are identified. IMPRESSION: Unremarkable noncontrast CT of the abdomen and pelvis. Electronically Signed   By: Roanna Raider M.D.   On: 12/26/2015 20:33   I have personally reviewed and evaluated these images and lab results as part of my medical decision-making.   EKG Interpretation None       MDM   Final diagnoses:  LUQ abdominal pain    35 yo F with a chief complaints of left upper quadrant abdominal pain. With colicky nature CT scan was negative for stone. Lab work unremarkable. Some improvement with a GI cocktail. Will discharge the patient home have her follow-up with her PCP and gastroenterology. Patient is frustrated that I do not come to the diagnosis. She is requesting her contacts for home. Discussed that without a definitive diagnosis that narcotics are dangerous medication and that she should follow-up with her doctor and have a likely EGD.  12:01 AM:  I have discussed the diagnosis/risks/treatment options with the patient and family and believe the pt to be eligible for discharge home to follow-up with PCP/GI. We also discussed returning to the ED immediately if new or worsening sx occur. We discussed the sx which are most concerning (e.g., sudden worsening pain, fever, inability to tolerate by mouth) that necessitate immediate return. Medications administered to the patient during their visit and any new prescriptions provided to the patient are listed below.  Medications given during this visit Medications  oxyCODONE-acetaminophen (PERCOCET/ROXICET) 5-325 MG per tablet 1 tablet (1 tablet Oral Not Given 12/26/15 1515)  diphenhydrAMINE (BENADRYL) capsule 50  mg (50 mg Oral Given 12/26/15 1554)  sodium chloride 0.9 % bolus 1,000 mL (0 mLs Intravenous Stopped 12/26/15 2106)  ketorolac (TORADOL) 30 MG/ML injection 30 mg (30 mg Intravenous Given 12/26/15 1940)  ondansetron (ZOFRAN) injection 4 mg (4 mg Intravenous Given 12/26/15 1940)  fentaNYL (SUBLIMAZE) injection 100 mcg (100 mcg Intravenous Given 12/26/15 1940)  gi cocktail (Maalox,Lidocaine,Donnatal) (30 mLs Oral Given 12/26/15 1943)    Discharge Medication List as of 12/26/2015  8:42 PM      The patient appears reasonably screen and/or stabilized for discharge and I doubt any other medical condition or other [facility]  requiring further screening, evaluation, or treatment in the ED at this time prior to discharge.      Melene Planan Minnie Legros, DO 12/27/15 0001

## 2015-12-26 NOTE — ED Notes (Signed)
Pt received percocet at triage, now has slight rash to neck, denies sob. Pt now given benadryl. No acute distress noted.

## 2015-12-28 ENCOUNTER — Encounter: Payer: Commercial Managed Care - HMO | Admitting: Rehabilitative and Restorative Service Providers"

## 2015-12-29 ENCOUNTER — Ambulatory Visit (INDEPENDENT_AMBULATORY_CARE_PROVIDER_SITE_OTHER): Payer: Commercial Managed Care - HMO | Admitting: Family Medicine

## 2015-12-29 ENCOUNTER — Encounter: Payer: Self-pay | Admitting: Family Medicine

## 2015-12-29 ENCOUNTER — Ambulatory Visit: Payer: Commercial Managed Care - HMO | Admitting: Family Medicine

## 2015-12-29 VITALS — BP 131/85 | HR 81 | Temp 98.1°F | Wt 202.0 lb

## 2015-12-29 DIAGNOSIS — R1012 Left upper quadrant pain: Secondary | ICD-10-CM | POA: Diagnosis not present

## 2015-12-29 MED ORDER — OMEPRAZOLE 40 MG PO CPDR: 40.0000 mg | DELAYED_RELEASE_CAPSULE | Freq: Two times a day (BID) | ORAL | Status: DC

## 2015-12-29 MED ORDER — SUCRALFATE 1 G PO TABS: 1.0000 g | ORAL_TABLET | Freq: Three times a day (TID) | ORAL | Status: DC

## 2015-12-29 NOTE — Progress Notes (Signed)
       Cathy Lester is a 35 y.o. female who [name] today for abdominal pain. Patient notes a several day history of viral left quadrant abdominal pain. She was seen in the emergency department on April 10 where CT scan of her labs are relatively normal. She's been taking naproxen pre-regularly for back pain. Additionally she had a history this last autumn of abdominal pain thought to be related to colitis. However she was never able to get a colonoscopy to workup the colitis that we thought was likely due to inflammatory bowel disease. She denies any fevers chills vomiting or diarrhea. She has not tried any treatment yet.   Past Medical History  Diagnosis Date  . Colitis    No past surgical history on file. Social History  Substance Use Topics  . Smoking status: Current Every Day Smoker -- 0.50 packs/day    Types: Cigarettes  . Smokeless tobacco: Not on file  . Alcohol Use: No   family history is not on file.  ROS as above Medications: Current Outpatient Prescriptions  Medication Sig Dispense Refill  . cyclobenzaprine (FLEXERIL) 10 MG tablet Take 1 tablet (10 mg total) by mouth 3 (three) times daily as needed for muscle spasms. 30 tablet 0  . naproxen (NAPROSYN) 500 MG tablet Take 1 tablet (500 mg total) by mouth 2 (two) times daily with a meal. 30 tablet 0  . omeprazole (PRILOSEC) 40 MG capsule Take 1 capsule (40 mg total) by mouth 2 (two) times daily. 60 capsule 1  . sucralfate (CARAFATE) 1 g tablet Take 1 tablet (1 g total) by mouth 4 (four) times daily -  with meals and at bedtime. 120 tablet 0  . traMADol (ULTRAM) 50 MG tablet Take 1 tablet (50 mg total) by mouth every 8 (eight) hours as needed. 15 tablet 0   No current facility-administered medications for this visit.   Allergies  Allergen Reactions  . Morphine And Related Nausea And Vomiting     Exam:  BP  131/85 mmHg  Pulse 81  Temp(Src) 98.1 F (36.7 C) (Oral)  Wt 202 lb (91.627 kg)  LMP 12/26/2015 Gen: Well NAD Nontoxic appearing HEENT: EOMI,  MMM Lungs: Normal work of breathing. CTABL Heart: RRR no MRG Abd: NABS, Soft. Nondistended, Nontender Exts: Brisk capillary refill, warm and well perfused.  Skin: No rash  Recent laboratory findings and CT scan abdomen and pelvis from 12/26/2015 reviewed  No results found for this or any previous visit (from the past 24 hour(s)). No results found.   Please see individual assessment and plan sections.

## 2015-12-29 NOTE — Patient Instructions (Addendum)
Thank you for coming in today. Take omeprazole twice daily for at least one week. Take Carafate 4 times daily for one week. Stop naproxen. Call if not better. Stop alcohol for a while.  If your belly pain worsens, or you have high fever, bad vomiting, blood in your stool or black tarry stool go to the Emergency Room.  If not better call and we will get you to the stomach doctors.    Gastritis, Adult Gastritis is soreness and swelling (inflammation) of the lining of the stomach. Gastritis can develop as a sudden onset (acute) or long-term (chronic) condition. If gastritis is not treated, it can lead to stomach bleeding and ulcers. CAUSES  Gastritis occurs when the stomach lining is weak or damaged. Digestive juices from the stomach then inflame the weakened stomach lining. The stomach lining may be weak or damaged due to viral or bacterial infections. One common bacterial infection is the Helicobacter pylori infection. Gastritis can also result from excessive alcohol consumption, taking certain medicines, or having too much acid in the stomach.  SYMPTOMS  In some cases, there are no symptoms. When symptoms are present, they may include:  Pain or a burning sensation in the upper abdomen.  Nausea.  Vomiting.  An uncomfortable feeling of fullness after eating. DIAGNOSIS  Your caregiver may suspect you have gastritis based on your symptoms and a physical exam. To determine the cause of your gastritis, your caregiver may perform the following:  Blood or stool tests to check for the H pylori bacterium.  Gastroscopy. A thin, flexible tube (endoscope) is passed down the esophagus and into the stomach. The endoscope has a light and camera on the end. Your caregiver uses the endoscope to view the inside of the stomach.  Taking a tissue sample (biopsy) from the stomach to examine under a microscope. TREATMENT  Depending on the cause of your gastritis, medicines may be prescribed. If you have a  bacterial infection, such as an H pylori infection, antibiotics may be given. If your gastritis is caused by too much acid in the stomach, H2 blockers or antacids may be given. Your caregiver may recommend that you stop taking aspirin, ibuprofen, or other nonsteroidal anti-inflammatory drugs (NSAIDs). HOME CARE INSTRUCTIONS  Only take over-the-counter or prescription medicines as directed by your caregiver.  If you were given antibiotic medicines, take them as directed. Finish them even if you start to feel better.  Drink enough fluids to keep your urine clear or pale yellow.  Avoid foods and drinks that make your symptoms worse, such as:  Caffeine or alcoholic drinks.  Chocolate.  Peppermint or mint flavorings.  Garlic and onions.  Spicy foods.  Citrus fruits, such as oranges, lemons, or limes.  Tomato-based foods such as sauce, chili, salsa, and pizza.  Fried and fatty foods.  Eat small, frequent meals instead of large meals. SEEK IMMEDIATE MEDICAL CARE IF:   You have black or dark red stools.  You vomit blood or material that looks like coffee grounds.  You are unable to keep fluids down.  Your abdominal pain gets worse.  You have a fever.  You do not feel better after 1 week.  You have any other questions or concerns. MAKE SURE YOU:  Understand these instructions.  Will watch your condition.  Will get help right away if you are not doing well or get worse.   This information is not intended to replace advice given to you by your health care provider. Make sure you discuss  any questions you have with your health care provider.   Document Released: 08/28/2001 Document Revised: 03/04/2012 Document Reviewed: 10/17/2011 Elsevier Interactive Patient Education [employer]

## 2015-12-29 NOTE — Assessment & Plan Note (Signed)
Concerning for gastritis likely due to NSAIDs. Plan for H. pylori breath test. Additionally abstain from NSAIDs and treat with twice daily omeprazole and Carafate. If not better would consider referral to GI for colonoscopy versus endoscopy.

## 2015-12-30 LAB — H. PYLORI BREATH TEST: H. pylori Breath Test: NOT DETECTED

## 2016-01-02 NOTE — Progress Notes (Signed)
Quick Note:  H Pylori bacteri test was negative. This is a good thing. ______

## 2016-02-07 ENCOUNTER — Encounter: Payer: Self-pay | Admitting: Family Medicine

## 2016-02-07 ENCOUNTER — Ambulatory Visit (INDEPENDENT_AMBULATORY_CARE_PROVIDER_SITE_OTHER): Payer: Commercial Managed Care - HMO | Admitting: Family Medicine

## 2016-02-07 VITALS — BP 131/86 | HR 78 | Temp 98.2°F | Wt 206.0 lb

## 2016-02-07 DIAGNOSIS — R1012 Left upper quadrant pain: Secondary | ICD-10-CM | POA: Diagnosis not present

## 2016-02-07 DIAGNOSIS — K529 Noninfective gastroenteritis and colitis, unspecified: Secondary | ICD-10-CM | POA: Diagnosis not present

## 2016-02-07 DIAGNOSIS — R5383 Other fatigue: Secondary | ICD-10-CM | POA: Diagnosis not present

## 2016-02-07 MED ORDER — BISMUTH SUBSALICYLATE 262 MG PO TABS: 1.0000 | ORAL_TABLET | Freq: Three times a day (TID) | ORAL | Status: DC

## 2016-02-07 MED ORDER — OMEPRAZOLE 40 MG PO CPDR: 40.0000 mg | DELAYED_RELEASE_CAPSULE | Freq: Two times a day (BID) | ORAL | Status: DC

## 2016-02-07 NOTE — Patient Instructions (Signed)
Thank you for coming in today. Get labs.  Take peptobismol three times daily.  Take omeprazole daily.  Continue omeprazole.  Follow up with [facility].  Make sure they send me records.  Return after your evaluation/colonscopy/upper GI test with your GI doctor.

## 2016-02-07 NOTE — Progress Notes (Signed)
       Cathy Lester is a 35 y.o. female who presents [name] today for abdominal pain and fatigue. Patient has had intermittent abdominal pain for several months now. She's had an evaluation with GI who recommended colonoscopy and upper endoscopy for presumed colitis/gastritis. She was unable to afford the diagnostic colonoscopy. Since then her symptoms have returned. She was started on Carafate which helped however she developed a rash to it. She tried taking the Carafate again in the rash returned. She thinks she is truly allergic. She continues to take omeprazole. She continues to note intermittent upper left quadrant abdominal pain. She has an appointment with gastroenterology in June.  Additionally patient has fatigue and is concerned she may be anemic.   Past Medical History  Diagnosis Date  . Colitis    No past surgical history on file. Social History  Substance Use Topics  . Smoking status: Current Every Day Smoker -- 0.50 packs/day    Types: Cigarettes  . Smokeless tobacco: Not on file  . Alcohol Use: No   family history is not on file.  ROS as above:  Medications: Current Outpatient Prescriptions  Medication Sig Dispense Refill  . Bismuth Subsalicylate 262 MG TABS Take 1 tablet (262 mg total) by mouth 3 (three) times daily. 90 each 6  . cyclobenzaprine (FLEXERIL) 10 MG tablet Take 1 tablet (10 mg total) by mouth 3 (three) times daily as needed for muscle spasms. 30 tablet 0  . naproxen (NAPROSYN) 500 MG tablet Take 1 tablet (500 mg total) by mouth 2 (two) times daily with a meal. 30 tablet 0  . omeprazole (PRILOSEC) 40 MG capsule Take 1 capsule (40 mg total) by mouth 2 (two) times daily. 60 capsule 12  . traMADol (ULTRAM) 50 MG tablet Take 1 tablet (50 mg total) by mouth every 8 (eight) hours as needed. 15 tablet 0   No current facility-administered  medications for this visit.   Allergies  Allergen Reactions  . Morphine And Related Nausea And Vomiting  . Carafate [Sucralfate] Rash    Rash     Exam:  BP 131/86 mmHg  Pulse 78  Temp(Src) 98.2 F (36.8 C) (Oral)  Wt 206 lb (93.441 kg)  SpO2 99% Gen: Well NAD HEENT: EOMI,  MMM Lungs: Normal work of breathing. CTABL Heart: RRR no MRG Abd: NABS, Soft. Nondistended, Mildly tender upper left quadrant without rebound or guarding Exts: Brisk capillary refill, warm and well perfused.   No results found for this or any previous visit (from the past 24 hour(s)). No results found.    Assessment and Plan: 35 y.o. female with abdominal pain likely gastritis. Follow-up with GI. Trial of Pepto-Bismol and omeprazole. We will check CBC ferritin and TIBC to assess for anemia and iron stores.  Discussed warning signs or symptoms. Please see discharge instructions. Patient expresses understanding.

## 2016-08-13 ENCOUNTER — Ambulatory Visit (INDEPENDENT_AMBULATORY_CARE_PROVIDER_SITE_OTHER): Payer: Commercial Managed Care - HMO | Admitting: Family Medicine

## 2016-08-13 ENCOUNTER — Encounter: Payer: Self-pay | Admitting: Family Medicine

## 2016-08-13 VITALS — BP 119/76 | HR 88 | Wt 209.0 lb

## 2016-08-13 DIAGNOSIS — G4700 Insomnia, unspecified: Secondary | ICD-10-CM | POA: Diagnosis not present

## 2016-08-13 DIAGNOSIS — R5383 Other fatigue: Secondary | ICD-10-CM | POA: Diagnosis not present

## 2016-08-13 MED ORDER — TRAZODONE HCL 50 MG PO TABS: 25.0000 mg | ORAL_TABLET | Freq: Every evening | ORAL | 3 refills | Status: DC | PRN

## 2016-08-13 NOTE — Progress Notes (Signed)
       Cathy Lester is a 35 y.o. female who [name] Medicine today for fatigue and insomnia  Patient notes a few weeks of fatigue and difficulty sleeping. 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 She works 6 12 hour shifts per week. She notes this is caused a great deal of fatigue. She also notes difficulty sleeping. She is able to fall asleep but she has difficulty staying asleep. She would like a work note written to allow her to take shorter shifts as needed. No fevers chills severe vomiting or diarrhea.   Past Medical History:  Diagnosis Date  . Colitis    No past surgical history on file. Social History  Substance Use Topics  . Smoking status: Current Every Day Smoker    Packs/day: 0.50    Types: Cigarettes  . Smokeless tobacco: Not on file  . Alcohol use No   family history is not on file.  ROS as above:  Medications: Current Outpatient Prescriptions  Medication Sig Dispense Refill  . dicyclomine (BENTYL) 10 MG capsule     . omeprazole (PRILOSEC) 40 MG capsule Take 40 mg by mouth daily.    . traZODone (DESYREL) 50 MG tablet Take 0.5-1 tablets (25-50 mg total) by mouth at bedtime as needed for sleep. 30 tablet 3   No current facility-administered medications for this visit.    Allergies  Allergen Reactions  . Morphine And Related Nausea And Vomiting  . Carafate [Sucralfate] Rash    Rash    Health Maintenance Health Maintenance  Topic Date Due  . TETANUS/TDAP  06/07/2002  . PAP SMEAR  06/07/2004  . INFLUENZA VACCINE  04/17/2016  . HIV Screening  Completed     Exam:  BP 119/76   Pulse 88   Wt 209 lb (94.8 kg)   BMI 32.73 kg/m  Gen: Well NAD HEENT: EOMI,  MMM No goiter Lungs: Normal work of breathing. CTABL Heart: RRR no MRG Abd: NABS, Soft. Nondistended, Nontender Exts: Brisk capillary refill,  warm and well perfused.  Psych: Alert and oriented normal speech process and affect.  Depression screen PHQ 2/9 08/13/2016  Decreased Interest 2  Down, Depressed, Hopeless 0  PHQ - 2 Score 2  Altered sleeping 3  Tired, decreased energy 3  Change in appetite 3  Feeling bad or failure about yourself  0  Trouble concentrating 0  Moving slowly or fidgety/restless 1  Suicidal thoughts 0  PHQ-9 Score 12      No results found for this or any previous visit (from the past 72 hour(s)). No results found.    Assessment and Plan: 35 y.o. female with fatigue. Likely related to depression symptoms are insomnia. There may be a metabolic cause as well. Plan to obtain limited workup listed below and treat with trazodone. Recheck in a few weeks. If not better would consider other SSRIs.  Work note provided.   Orders Placed This Encounter  Procedures  . CBC  . COMPLETE METABOLIC PANEL WITH GFR  . Ferritin  . Iron and TIBC  . TSH    Discussed warning signs or symptoms. Please see discharge instructions. Patient expresses understanding.

## 2016-08-13 NOTE — Patient Instructions (Signed)
Thank you for coming in today. Get labs today.  Take trazodone at bed time as needed.  Return for recheck in 2-4 weeks.

## 2016-08-14 LAB — COMPLETE METABOLIC PANEL WITH GFR
ALBUMIN: 4.4 g/dL (ref 3.6–5.1)
ALK PHOS: 50 U/L (ref 33–115)
ALT: 24 U/L (ref 6–29)
AST: 42 U/L — AB (ref 10–30)
BUN: 10 mg/dL (ref 7–25)
CHLORIDE: 105 mmol/L (ref 98–110)
CO2: 23 mmol/L (ref 20–31)
Calcium: 9.2 mg/dL (ref 8.6–10.2)
Creat: 0.7 mg/dL (ref 0.50–1.10)
GFR, Est African American: >89 mL/min (ref >=60)
GFR, Est Non African American: >89 mL/min (ref >=60)
GLUCOSE: 96 mg/dL (ref 65–99)
POTASSIUM: 4 mmol/L (ref 3.5–5.3)
SODIUM: 136 mmol/L (ref 135–146)
Total Bilirubin: 0.7 mg/dL (ref 0.2–1.2)
Total Protein: 7.3 g/dL (ref 6.1–8.1)

## 2016-08-14 LAB — CBC
HCT: 37.9 % (ref 35.0–45.0)
HEMOGLOBIN: 12.4 g/dL (ref 11.7–15.5)
MCH: 28.2 pg (ref 27.0–33.0)
MCHC: 32.7 g/dL (ref 32.0–36.0)
MCV: 86.3 fL (ref 80.0–100.0)
MPV: 10.4 fL (ref 7.5–12.5)
PLATELETS: 313 10*3/uL (ref 140–400)
RBC: 4.39 MIL/uL (ref 3.80–5.10)
RDW: 14 % (ref 11.0–15.0)
WBC: 7.7 10*3/uL (ref 3.8–10.8)

## 2016-08-14 LAB — IRON AND TIBC
%SAT: 36 % (ref 11–50)
Iron: 126 ug/dL (ref 40–190)
TIBC: 348 ug/dL (ref 250–450)
UIBC: 222 ug/dL (ref 125–400)

## 2016-08-14 LAB — FERRITIN: Ferritin: 40 ng/mL (ref 10–154)

## 2016-08-14 LAB — TSH: TSH: 1.63 m[IU]/L

## 2016-09-03 ENCOUNTER — Ambulatory Visit: Payer: Commercial Managed Care - HMO | Admitting: Family Medicine

## 2016-09-04 ENCOUNTER — Encounter (HOSPITAL_COMMUNITY): Payer: Self-pay

## 2016-09-04 ENCOUNTER — Emergency Department (HOSPITAL_COMMUNITY)
Admission: EM | Admit: 2016-09-04 | Discharge: 2016-09-04 | Disposition: A | Discharge status: Home / Self Care | Payer: Commercial Managed Care - HMO | Attending: Emergency Medicine | Admitting: Emergency Medicine

## 2016-09-04 DIAGNOSIS — S0990XA Unspecified injury of head, initial encounter: Secondary | ICD-10-CM | POA: Insufficient documentation

## 2016-09-04 DIAGNOSIS — F17210 Nicotine dependence, cigarettes, uncomplicated: Secondary | ICD-10-CM | POA: Diagnosis not present

## 2016-09-04 DIAGNOSIS — Y939 Activity, unspecified: Secondary | ICD-10-CM | POA: Insufficient documentation

## 2016-09-04 DIAGNOSIS — M542 Cervicalgia: Secondary | ICD-10-CM | POA: Insufficient documentation

## 2016-09-04 DIAGNOSIS — Y92410 Unspecified street and highway as the place of occurrence of the external cause: Secondary | ICD-10-CM | POA: Insufficient documentation

## 2016-09-04 DIAGNOSIS — Y999 Unspecified external cause status: Secondary | ICD-10-CM | POA: Insufficient documentation

## 2016-09-04 MED ORDER — KETOROLAC TROMETHAMINE 60 MG/2ML IM SOLN
60.0000 mg | Freq: Once | INTRAMUSCULAR | Status: AC
  Administered 2016-09-04: 60 mg via INTRAMUSCULAR
  Filled 2016-09-04: qty 2

## 2016-09-04 MED ORDER — METHOCARBAMOL 500 MG PO TABS: 500.0000 mg | ORAL_TABLET | Freq: Two times a day (BID) | ORAL | 0 refills | Status: DC

## 2016-09-04 MED ORDER — NAPROXEN 500 MG PO TABS: 500.0000 mg | ORAL_TABLET | Freq: Two times a day (BID) | ORAL | 0 refills | Status: DC

## 2016-09-04 MED ORDER — LIDOCAINE 5 % EX PTCH: 1.0000 | MEDICATED_PATCH | CUTANEOUS | 0 refills | Status: DC

## 2016-09-04 MED ORDER — METHOCARBAMOL 500 MG PO TABS
1000.0000 mg | ORAL_TABLET | Freq: Once | ORAL | Status: AC
  Administered 2016-09-04: 1000 mg via ORAL
  Filled 2016-09-04: qty 2
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
## 2016-10-25 ENCOUNTER — Ambulatory Visit: Payer: Commercial Managed Care - HMO | Admitting: Family Medicine

## 2016-10-25 IMAGING — DX DG HIP (WITH OR WITHOUT PELVIS) 2-3V*R*
3 series · 3 of 3 positions shown · non-contrast
Comparison: None.

CLINICAL DATA: 34-year-old female with right hip pain

EXAM:
DG HIP (WITH OR WITHOUT PELVIS) 2-3V RIGHT

[pelvis ap]
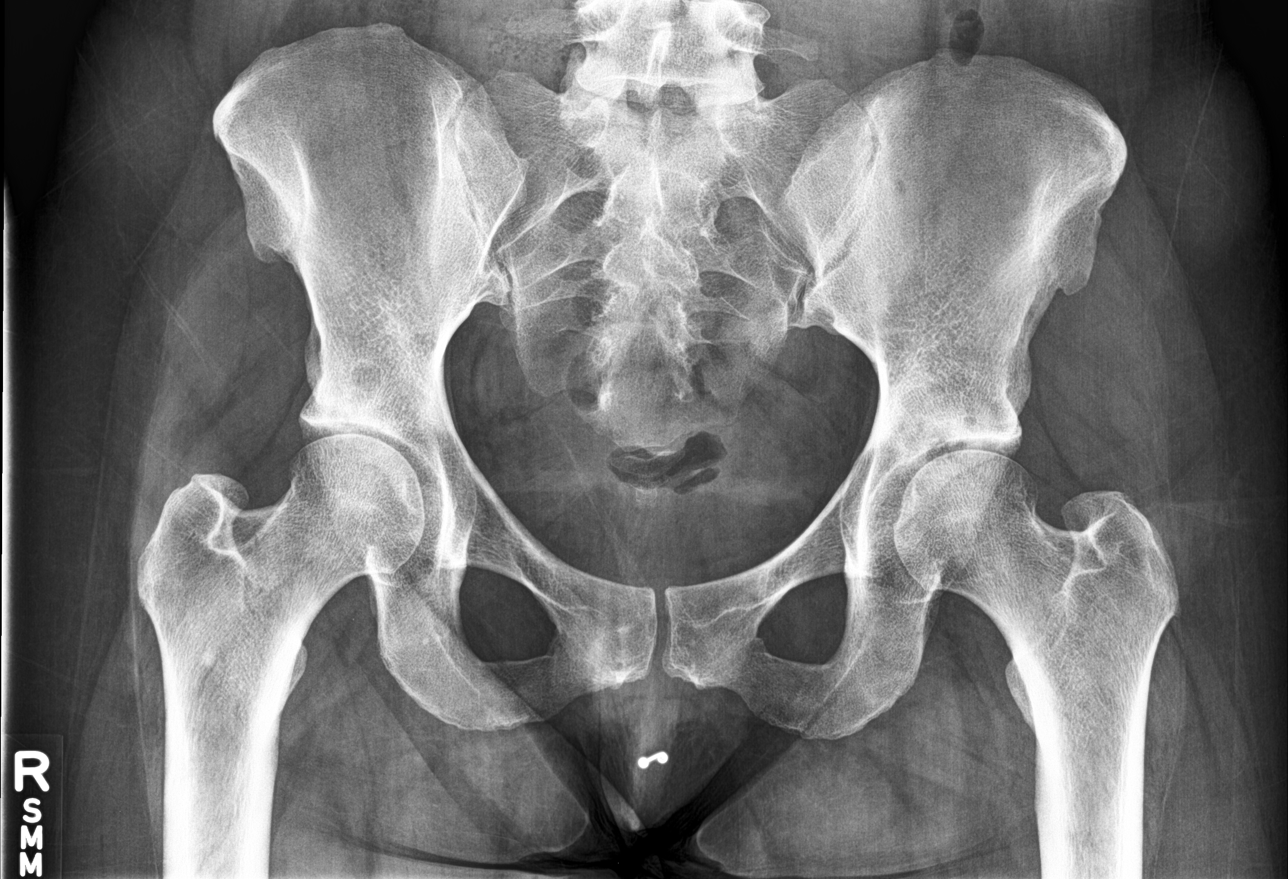

[hip ap]
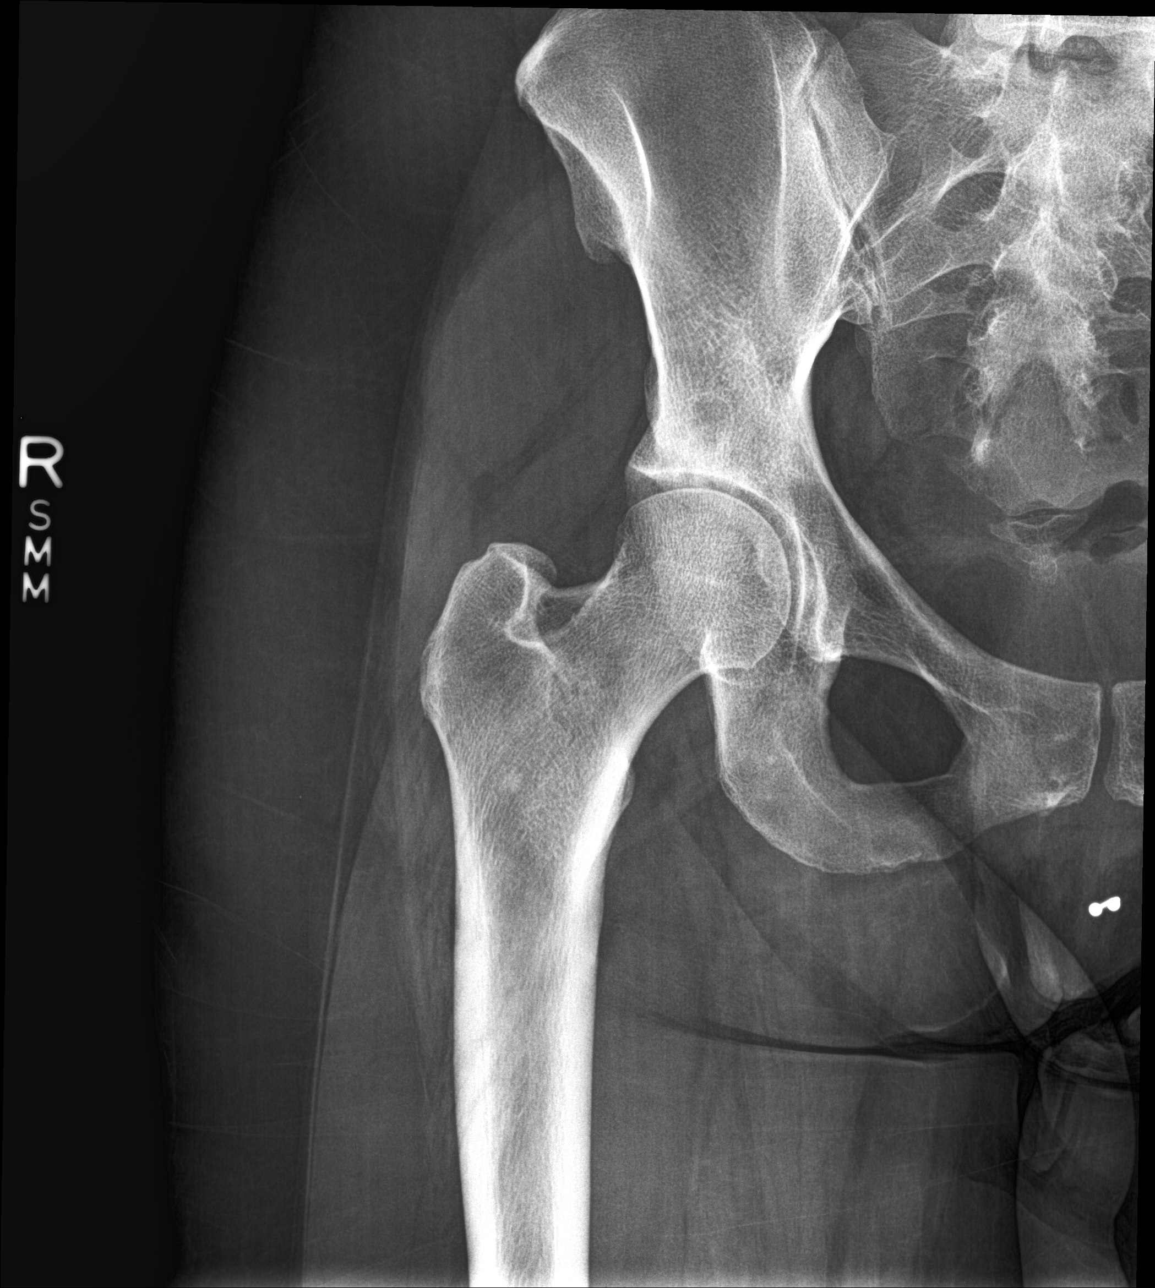

[hip frog leg]
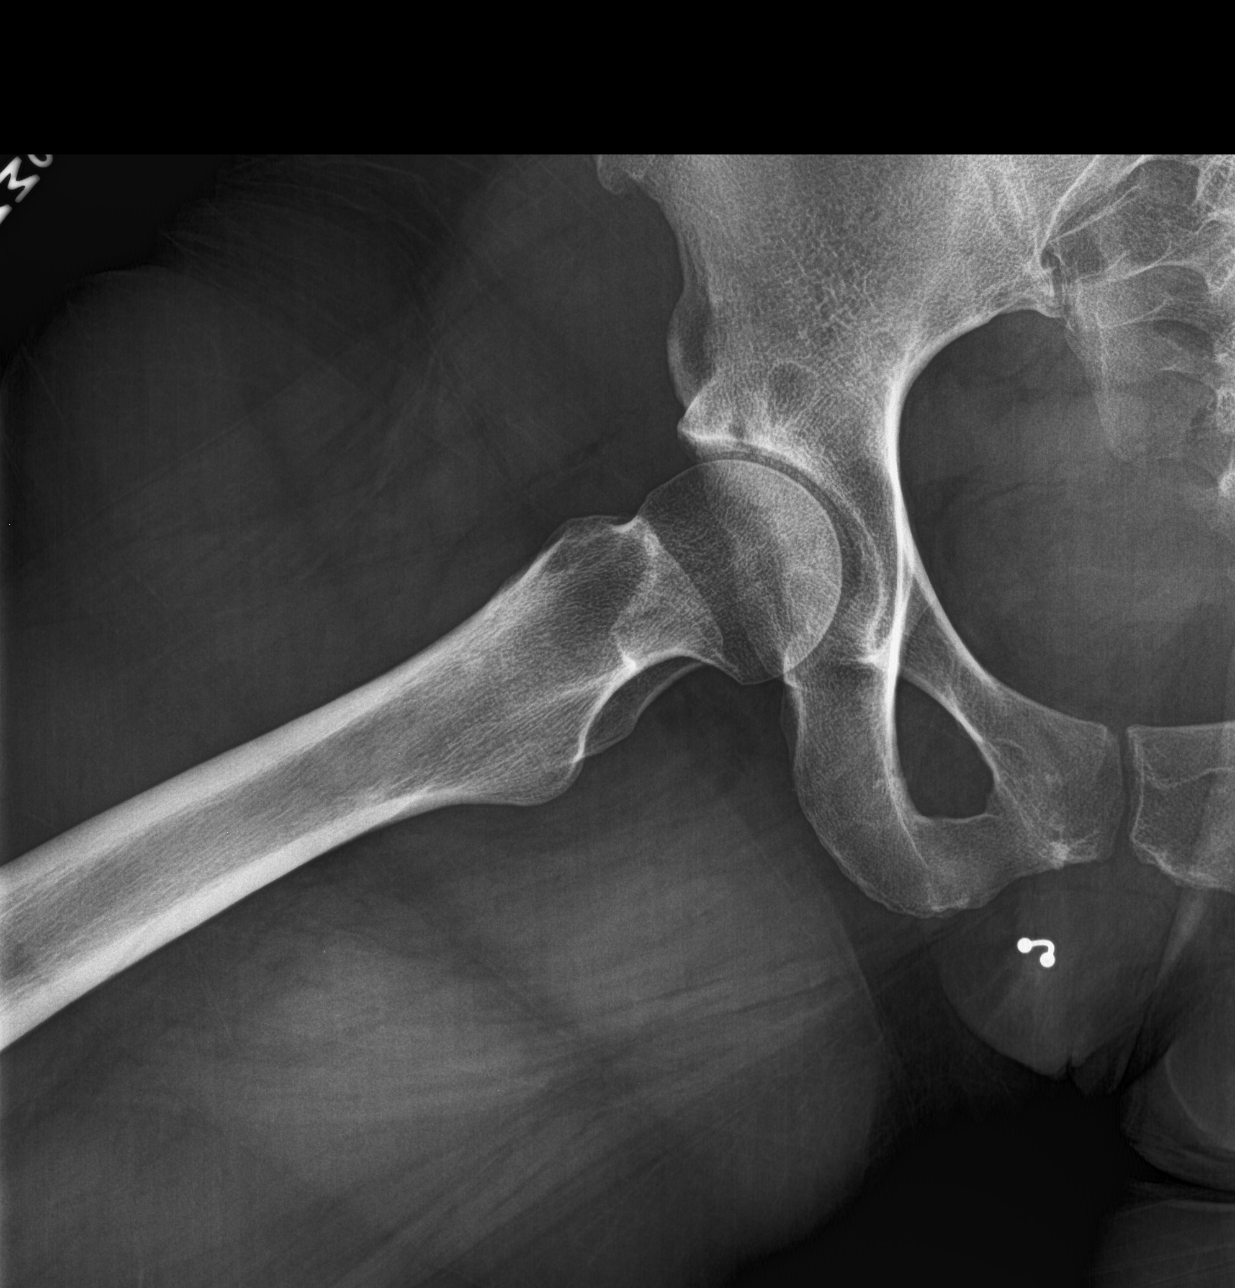

[3 of 3 positions shown; findings below may reference images not displayed]

FINDINGS: There is no evidence of hip fracture or dislocation. There is no
evidence of arthropathy or other focal bone abnormality.
IMPRESSION: Negative.

## 2016-12-11 ENCOUNTER — Encounter: Payer: Self-pay | Admitting: Family Medicine

## 2016-12-11 ENCOUNTER — Ambulatory Visit (INDEPENDENT_AMBULATORY_CARE_PROVIDER_SITE_OTHER): Payer: Commercial Managed Care - HMO | Admitting: Family Medicine

## 2016-12-11 VITALS — BP 120/80 | HR 95 | Temp 97.8°F | Wt 205.0 lb

## 2016-12-11 DIAGNOSIS — K529 Noninfective gastroenteritis and colitis, unspecified: Secondary | ICD-10-CM

## 2016-12-11 DIAGNOSIS — J0100 Acute maxillary sinusitis, unspecified: Secondary | ICD-10-CM | POA: Diagnosis not present

## 2016-12-11 DIAGNOSIS — G4700 Insomnia, unspecified: Secondary | ICD-10-CM

## 2016-12-11 MED ORDER — AZITHROMYCIN 250 MG PO TABS: 250.0000 mg | ORAL_TABLET | Freq: Every day | ORAL | 0 refills | Status: DC

## 2016-12-11 MED ORDER — IPRATROPIUM BROMIDE 0.06 % NA SOLN: 2.0000 | NASAL | 6 refills | Status: DC | PRN

## 2016-12-11 MED ORDER — BENZONATATE 200 MG PO CAPS
200.0000 mg | ORAL_CAPSULE | Freq: Three times a day (TID) | ORAL | 0 refills | Status: DC | PRN
Start: 2016-12-11 — End: 2017-10-17

## 2016-12-11 MED ORDER — TRAZODONE HCL 50 MG PO TABS: 25.0000 mg | ORAL_TABLET | Freq: Every evening | ORAL | 3 refills | Status: DC | PRN

## 2016-12-11 NOTE — Patient Instructions (Signed)
Thank you for coming in today. Use azithromycin if not better.  Call or go to the emergency room if you get worse, have trouble breathing, have chest pains, or palpitations.    Sinusitis, Adult Sinusitis is soreness and inflammation of your sinuses. Sinuses are hollow spaces in the bones around your face. They are located:  Around your eyes.  In the middle of your forehead.  Behind your nose.  In your cheekbones. Your sinuses and nasal passages are lined with a stringy fluid (mucus). Mucus normally drains out of your sinuses. When your nasal tissues get inflamed or swollen, the mucus can get trapped or blocked so air cannot flow through your sinuses. This lets bacteria, viruses, and funguses grow, and that leads to infection. Follow these instructions at home: Medicines   Take, use, or apply over-the-counter and prescription medicines only as told by your doctor. These may include nasal sprays.  If you were prescribed an antibiotic medicine, take it as told by your doctor. Do not stop taking the antibiotic even if you start to feel better. Hydrate and Humidify   Drink enough water to keep your pee (urine) clear or pale yellow.  Use a cool mist humidifier to keep the humidity level in your home above 50%.  Breathe in steam for 10-15 minutes, 3-4 times a day or as told by your doctor. You can do this in the bathroom while a hot shower is running.  Try not to spend time in cool or dry air. Rest   Rest as much as possible.  Sleep with your head raised (elevated).  Make sure to get enough sleep each night. General instructions   Put a warm, moist washcloth on your face 3-4 times a day or as told by your doctor. This will help with discomfort.  Wash your hands often with soap and water. If there is no soap and water, use hand sanitizer.  Do not smoke. Avoid being around people who are smoking (secondhand smoke).  Keep all follow-up visits as told by your doctor. This is  important. Contact a doctor if:  You have a fever.  Your symptoms get worse.  Your symptoms do not get better within 10 days. Get help right away if:  You have a very bad headache.  You cannot stop throwing up (vomiting).  You have pain or swelling around your face or eyes.  You have trouble seeing.  You feel confused.  Your neck is stiff.  You have trouble breathing. This information is not intended to replace advice given to you by your health care provider. Make sure you discuss any questions you have with your health care provider. Document Released: 02/20/2008 Document Revised: 04/29/2016 Document Reviewed: 06/29/2015 Elsevier Interactive Patient Education  2017 [employer]

## 2016-12-11 NOTE — Progress Notes (Signed)
Cathy Lester is a 36 y.o. female who presents to [name]: Primary Care Sports Medicine today for sinus pain and pressure. Symptoms present for 3 days associated with headache cough and congestion. She's tried some Tylenol and some over-the-counter allergy medicines which helped a bit. She denies vomiting diarrhea chest pain or palpitations.  Additionally patient notes continued insomnia. She has run out of trazodone. She has been very helpful previously would like a refill.   Lastly colitis: Patient has a history of colitis. She takes dicyclomine intermittently and omeprazole daily. She notes that she needs a new letter for work. She needs a letter to state that she'll have intermittent leave this previously outlined in the family form. Additionally she needs the letter to essentially state that she should be allowed to not necessarily work longer than 8 hours as needed.   Past Medical History:  Diagnosis Date  . Colitis    No past surgical history on file. Social History  Substance Use Topics  . Smoking status: Current Every Day Smoker    Packs/day: 0.25    Types: Cigarettes  . Smokeless tobacco: Never Used  . Alcohol use No   family history is not on file.  ROS as above:  Medications: Current Outpatient Prescriptions  Medication Sig Dispense Refill  . dicyclomine (BENTYL) 10 MG capsule     . omeprazole (PRILOSEC) 40 MG capsule Take 40 mg by mouth daily.    Marland Kitchen azithromycin (ZITHROMAX) 250 MG tablet Take 1 tablet (250 mg total) by mouth daily. Take first 2 tablets together, then 1 every day until finished. 6 tablet 0  . benzonatate (TESSALON) 200 MG capsule Take 1 capsule (200 mg total) by mouth 3 (three) times daily as needed for cough. 45 capsule 0  . ipratropium (ATROVENT) 0.06 % nasal spray Place 2 sprays into both nostrils every 4 (four) hours as needed for rhinitis. 10 mL 6  .  traZODone (DESYREL) 50 MG tablet Take 0.5-1 tablets (25-50 mg total) by mouth at bedtime as needed for sleep. 90 tablet 3   No current facility-administered medications for this visit.    Allergies  Allergen Reactions  . Morphine And Related Nausea And Vomiting  . Carafate [Sucralfate] Rash    Rash    Health Maintenance Health Maintenance  Topic Date Due  . TETANUS/TDAP  06/07/2002  . PAP SMEAR  06/07/2004  . INFLUENZA VACCINE  04/17/2016  . HIV Screening  Completed     Exam:  BP 120/80   Pulse 95   Temp 97.8 F (36.6 C) (Oral)   Wt 205 lb (93 kg)   SpO2 100%   BMI 32.11 kg/m  Gen: Well NAD HEENT: EOMI,  MMM Clear nasal discharge. Posterior pharynx with cobblestoning normal tympanic membranes bilaterally. Mild cervical lymphadenopathy present bilaterally. Lungs: Normal work of breathing. CTABL Heart: RRR no MRG Abd: NABS, Soft. Nondistended, Nontender Exts: Brisk capillary refill, warm and well perfused.    No results found for this or any previous visit (from the past 72 hour(s)). No results found.    Assessment and Plan: 36 y.o. female with  Sinusitis. Treatment with Atrovent nasal spray Tessalon perles and use backup azithromycin if worsening.  Insomnia refill trazodone.  Colitis: Letter written today. We'll fill out FMLA form as needed.   No orders of the defined types were placed in this encounter.  Meds ordered this encounter  Medications  . traZODone (DESYREL) 50 MG tablet  Sig: Take 0.5-1 tablets (25-50 mg total) by mouth at bedtime as needed for sleep.    Dispense:  90 tablet    Refill:  3  . ipratropium (ATROVENT) 0.06 % nasal spray    Sig: Place 2 sprays into both nostrils every 4 (four) hours as needed for rhinitis.    Dispense:  10 mL    Refill:  6  . benzonatate (TESSALON) 200 MG capsule    Sig: Take 1 capsule (200 mg total) by mouth 3 (three) times daily as needed for cough.    Dispense:  45 capsule    Refill:  0  . azithromycin  (ZITHROMAX) 250 MG tablet    Sig: Take 1 tablet (250 mg total) by mouth daily. Take first 2 tablets together, then 1 every day until finished.    Dispense:  6 tablet    Refill:  0     Discussed warning signs or symptoms. Please see discharge instructions. Patient expresses understanding.

## 2016-12-12 ENCOUNTER — Ambulatory Visit (INDEPENDENT_AMBULATORY_CARE_PROVIDER_SITE_OTHER): Payer: Commercial Managed Care - HMO | Admitting: Family Medicine

## 2016-12-12 ENCOUNTER — Encounter: Payer: Self-pay | Admitting: Family Medicine

## 2016-12-12 VITALS — BP 124/86 | HR 86

## 2016-12-12 DIAGNOSIS — K529 Noninfective gastroenteritis and colitis, unspecified: Secondary | ICD-10-CM

## 2016-12-12 DIAGNOSIS — R5383 Other fatigue: Secondary | ICD-10-CM

## 2016-12-12 NOTE — Progress Notes (Signed)
       Cathy Lester is a 36 y.o. female who presents to [facility]: Primary Care Sports Medicine today for fatigue and colitis. Patient is here with FMLA paperwork related to her fatigue and colitis. She is getting a lot of pushback from her immediate supervisor. She notes significant fatigue related to her colitis. Occasionally she will have flareups of colitis requiring her to miss work. Occasionally the colitis flare up and cause significant fatigue making it hard for her to work longer than 8 hours at a time. She would like a letter and FMLA paperwork stating this.  Past Medical History:  Diagnosis Date  . Colitis    No past surgical history on file. Social History  Substance Use Topics  . Smoking status: Current Every Day Smoker    Packs/day: 0.25    Types: Cigarettes  . Smokeless tobacco: Never Used  . Alcohol use No   family history is not on file.  ROS as above:  Medications: Current Outpatient Prescriptions  Medication Sig Dispense Refill  . azithromycin (ZITHROMAX) 250 MG tablet Take 1 tablet (250 mg total) by mouth daily. Take first 2 tablets together, then 1 every day until finished. 6 tablet 0  . benzonatate (TESSALON) 200 MG capsule Take 1 capsule (200 mg total) by mouth 3 (three) times daily as needed for cough. 45 capsule 0  . dicyclomine (BENTYL) 10 MG capsule     . ipratropium (ATROVENT) 0.06 % nasal spray Place 2 sprays into both nostrils every 4 (four) hours as needed for rhinitis. 10 mL 6  . omeprazole (PRILOSEC) 40 MG capsule Take 40 mg by mouth daily.    . traZODone (DESYREL) 50 MG tablet Take 0.5-1 tablets (25-50 mg total) by mouth at bedtime as needed for sleep. 90 tablet 3   No current facility-administered medications for this visit.    Allergies  Allergen Reactions  . Morphine And Related Nausea And Vomiting  . Carafate [Sucralfate] Rash    Rash    Health  Maintenance Health Maintenance  Topic Date Due  . TETANUS/TDAP  06/07/2002  . PAP SMEAR  06/07/2004  . INFLUENZA VACCINE  04/17/2016  . HIV Screening  Completed     Exam:  BP 124/86   Pulse 86  Gen: Well NAD HEENT: EOMI,  MMM Lungs: Normal work of breathing. CTABL Heart: RRR no MRG Abd: NABS, Soft. Nondistended, Nontender Exts: Brisk capillary refill, warm and well perfused.    No results found for this or any previous visit (from the past 72 hour(s)). No results found.    Assessment and Plan: 36 y.o. female with Colitis with significant fatigue. FMLA paperwork filled out and will be scanned. Letter written today.   No orders of the defined types were placed in this encounter.  No orders of the defined types were placed in this encounter.    Discussed warning signs or symptoms. Please see discharge instructions. Patient expresses understanding.  I spent 20 minutes with this patient, greater than 50% was face-to-face time counseling regarding the above diagnosis.

## 2016-12-26 ENCOUNTER — Ambulatory Visit (INDEPENDENT_AMBULATORY_CARE_PROVIDER_SITE_OTHER): Payer: Commercial Managed Care - HMO | Admitting: Family Medicine

## 2016-12-26 VITALS — BP 124/47 | HR 78

## 2016-12-26 DIAGNOSIS — G4700 Insomnia, unspecified: Secondary | ICD-10-CM

## 2016-12-26 DIAGNOSIS — F322 Major depressive disorder, single episode, severe without psychotic features: Secondary | ICD-10-CM | POA: Diagnosis not present

## 2016-12-26 MED ORDER — SERTRALINE HCL 25 MG PO TABS: ORAL_TABLET | ORAL | 2 refills | Status: DC

## 2016-12-26 NOTE — Patient Instructions (Signed)
Thank you for coming in today. Start zoloft daily.  If it makes you sleepy take at night. If it wakes you up take in the morning.  Take 1 pill daily for 1-2 weeks and then increase to 2 pills.  Recheck in 2-3 weeks.  I am out of town the week of April 23rd.  The other doctors here can help you if you are having a crisis.   Continue trazodone at night as needed for insomnia.   You should hear about therapy/counseling soon. Let me know if you do not hear anything.  You can also start looking for a therapist yourself and schedule your own appt.  Look for LCSW (Licensed Clinical Social Workers).     Major Depressive Disorder, Adult Major depressive disorder (MDD) is a mental health condition. It may also be called clinical depression or unipolar depression. MDD usually causes feelings of sadness, hopelessness, or helplessness. MDD can also cause physical symptoms. It can interfere with work, school, relationships, and other everyday activities. MDD may be mild, moderate, or severe. It may occur once (single episode major depressive disorder) or it may occur multiple times (recurrent major depressive disorder). What are the causes? The exact cause of this condition is not known. MDD is most likely caused by a combination of things, which may include:  Genetic factors. These are traits that are passed along from parent to child.  Individual factors. Your personality, your behavior, and the way you handle your thoughts and feelings may contribute to MDD. This includes personality traits and behaviors learned from others.  Physical factors, such as:  Differences in the part of your brain that controls emotion. This part of your brain may be different than it is in people who do not have MDD.  Long-term (chronic) medical or psychiatric illnesses.  Social factors. Traumatic experiences or major life changes may play a role in the development of MDD. What increases the risk? This condition is  more likely to develop in women. The following factors may also make you more likely to develop MDD:  A family history of depression.  Troubled family relationships.  Abnormally low levels of certain brain chemicals.  Traumatic events in childhood, especially abuse or the loss of a parent.  Being under a lot of stress, or long-term stress, especially from upsetting life experiences or losses.  A history of:  Chronic physical illness.  Other mental health disorders.  Substance abuse.  Poor living conditions.  Experiencing social exclusion or discrimination on a regular basis. What are the signs or symptoms? The main symptoms of MDD typically include:  Constant depressed or irritable mood.  Loss of interest in things and activities. MDD symptoms may also include:  Sleeping or eating too much or too little.  Unexplained weight change.  Fatigue or low energy.  Feelings of worthlessness or guilt.  Difficulty thinking clearly or making decisions.  Thoughts of suicide or of harming others.  Physical agitation or weakness.  Isolation. Severe cases of MDD may also occur with other symptoms, such as:  Delusions or hallucinations, in which you imagine things that are not real (psychotic depression).  Low-level depression that lasts at least a year (chronic depression or persistent depressive disorder).  Extreme sadness and hopelessness (melancholic depression).  Trouble speaking and moving (catatonic depression). How is this diagnosed? This condition may be diagnosed based on:  Your symptoms.  Your medical history, including your mental health history. This may involve tests to evaluate your mental health. You may  be asked questions about your lifestyle, including any drug and alcohol use, and how long you have had symptoms of MDD.  A physical exam.  Blood tests to rule out other conditions. You must have a depressed mood and at least four other MDD symptoms most  of the day, nearly every day in the same 2-week timeframe before your health care provider can confirm a diagnosis of MDD. How is this treated? This condition is usually treated by mental health professionals, such as psychologists, psychiatrists, and clinical social workers. You may need more than one type of treatment. Treatment may include:  Psychotherapy. This is also called talk therapy or counseling. Types of psychotherapy include:  Cognitive behavioral therapy (CBT). This type of therapy teaches you to recognize unhealthy feelings, thoughts, and behaviors, and replace them with positive thoughts and actions.  Interpersonal therapy (IPT). This helps you to improve the way you relate to and communicate with others.  Family therapy. This treatment includes members of your family.  Medicine to treat anxiety and depression, or to help you control certain emotions and behaviors.  Lifestyle changes, such as:  Limiting alcohol and drug use.  Exercising regularly.  Getting plenty of sleep.  Making healthy eating choices.  Spending more time outdoors. Treatments involving stimulation of the brain can be used in situations with extremely severe symptoms, or when medicine or other therapies do not work over time. These treatments include electroconvulsive therapy, transcranial magnetic stimulation, and vagal nerve stimulation. Follow these instructions at home: Activity   Return to your normal activities as told by your health care provider.  Exercise regularly and spend time outdoors as told by your health care provider. General instructions   Take over-the-counter and prescription medicines only as told by your health care provider.  Do not drink alcohol. If you drink alcohol, limit your alcohol intake to no more than 1 drink a day for nonpregnant women and 2 drinks a day for men. One drink equals 12 oz of beer, 5 oz of wine, or 1 oz of hard liquor. Alcohol can affect any  antidepressant medicines you are taking. Talk to your health care provider about your alcohol use.  Eat a healthy diet and get plenty of sleep.  Find activities that you enjoy doing, and make time to do them.  Consider joining a support group. Your health care provider may be able to recommend a support group.  Keep all follow-up visits as told by your health care provider. This is important. Where to find more information: [employer] on Mental Illness  www.nami.org [place] [employer] of Mental Health  http://www.maynard.net/ National Suicide Prevention Lifeline  1-800-273-TALK 417-474-2248). This is free, 24-hour help. Contact a health care provider if:  Your symptoms get worse.  You develop new symptoms. Get help right away if:  You self-harm.  You have serious thoughts about hurting yourself or others.  You see, hear, taste, smell, or feel things that are not present (hallucinate). This information is not intended to replace advice given to you by your health care provider. Make sure you discuss any questions you have with your health care provider. Document Released: 12/29/2012 Document Revised: 05/10/2016 Document Reviewed: 03/14/2016 Elsevier Interactive Patient Education  2017 Elsevier Inc.   Sertraline tablets What is this medicine? SERTRALINE (SER tra leen) is used to treat depression. It may also be used to treat obsessive compulsive disorder, panic disorder, post-trauma stress, premenstrual dysphoric disorder (PMDD) or social anxiety. This medicine may be used for other  purposes; ask your health care provider or pharmacist if you have questions. COMMON BRAND NAME(S): Zoloft What should I tell my health care provider before I take this medicine? They need to know if you have any of these conditions: -bleeding disorders -bipolar disorder or a family history of bipolar disorder -glaucoma -heart disease -high blood pressure -history of irregular  heartbeat -history of low levels of calcium, magnesium, or potassium in the blood -if you often drink alcohol -liver disease -receiving electroconvulsive therapy -seizures -suicidal thoughts, plans, or attempt; a previous suicide attempt by you or a family member -take medicines that treat or prevent blood clots -thyroid disease -an unusual or allergic reaction to sertraline, other medicines, foods, dyes, or preservatives -pregnant or trying to get pregnant -breast-feeding How should I use this medicine? Take this medicine by mouth with a glass of water. Follow the directions on the prescription label. You can take it with or without food. Take your medicine at regular intervals. Do not take your medicine more often than directed. Do not stop taking this medicine suddenly except upon the advice of your doctor. Stopping this medicine too quickly may cause serious side effects or your condition may worsen. A special MedGuide will be given to you by the pharmacist with each prescription and refill. Be sure to read this information carefully each time. Talk to your pediatrician regarding the use of this medicine in children. While this drug may be prescribed for children as young as 7 years for selected conditions, precautions do apply. Overdosage: If you think you have taken too much of this medicine contact a poison control center or emergency room at once. NOTE: This medicine is only for you. Do not share this medicine with others. What if I miss a dose? If you miss a dose, take it as soon as you can. If it is almost time for your next dose, take only that dose. Do not take double or extra doses. What may interact with this medicine? Do not take this medicine with any of the following medications: -cisapride -dofetilide -dronedarone -linezolid -MAOIs like Carbex, Eldepryl, Marplan, Nardil, and Parnate -methylene blue (injected into a vein) -pimozide -thioridazine This medicine may also  interact with the following medications: -alcohol -amphetamines -aspirin and aspirin-like medicines -certain medicines for depression, anxiety, or psychotic disturbances -certain medicines for fungal infections like ketoconazole, fluconazole, posaconazole, and itraconazole -certain medicines for irregular heart beat like flecainide, quinidine, propafenone -certain medicines for migraine headaches like almotriptan, eletriptan, frovatriptan, naratriptan, rizatriptan, sumatriptan, zolmitriptan -certain medicines for sleep -certain medicines for seizures like carbamazepine, valproic acid, phenytoin -certain medicines that treat or prevent blood clots like warfarin, enoxaparin, dalteparin -cimetidine -digoxin -diuretics -fentanyl -isoniazid -lithium -NSAIDs, medicines for pain and inflammation, like ibuprofen or naproxen -other medicines that prolong the QT interval (cause an abnormal heart rhythm) -rasagiline -safinamide -supplements like St. John's wort, kava kava, valerian -tolbutamide -tramadol -tryptophan This list may not describe all possible interactions. Give your health care provider a list of all the medicines, herbs, non-prescription drugs, or dietary supplements you use. Also tell them if you smoke, drink alcohol, or use illegal drugs. Some items may interact with your medicine. What should I watch for while using this medicine? Tell your doctor if your symptoms do not get better or if they get worse. Visit your doctor or health care professional for regular checks on your progress. Because it may take several weeks to see the full effects of this medicine, it is important to continue your treatment  as prescribed by your doctor. Patients and their families should watch out for new or worsening thoughts of suicide or depression. Also watch out for sudden changes in feelings such as feeling anxious, agitated, panicky, irritable, hostile, aggressive, impulsive, severely restless,  overly excited and hyperactive, or not being able to sleep. If this happens, especially at the beginning of treatment or after a change in dose, call your health care professional. Bonita Quin may get drowsy or dizzy. Do not drive, use machinery, or do anything that needs mental alertness until you know how this medicine affects you. Do not stand or sit up quickly, especially if you are an older patient. This reduces the risk of dizzy or fainting spells. Alcohol may interfere with the effect of this medicine. Avoid alcoholic drinks. Your mouth may get dry. Chewing sugarless gum or sucking hard candy, and drinking plenty of water may help. Contact your doctor if the problem does not go away or is severe. What side effects may I notice from receiving this medicine? Side effects that you should report to your doctor or health care professional as soon as possible: -allergic reactions like skin rash, itching or hives, swelling of the face, lips, or tongue -anxious -black, tarry stools -changes in vision -confusion -elevated mood, decreased need for sleep, racing thoughts, impulsive behavior -eye pain -fast, irregular heartbeat -feeling faint or lightheaded, falls -feeling agitated, angry, or irritable -hallucination, loss of contact with reality -loss of balance or coordination -loss of memory -painful or prolonged erections -restlessness, pacing, inability to keep still -seizures -stiff muscles -suicidal thoughts or other mood changes -trouble sleeping -unusual bleeding or bruising -unusually weak or tired -vomiting Side effects that usually do not require medical attention (report to your doctor or health care professional if they continue or are bothersome): -change in appetite or weight -change in sex drive or performance -diarrhea -increased sweating -indigestion, nausea -tremors This list may not describe all possible side effects. Call your doctor for medical advice about side effects.  You may report side effects to FDA at 1-800-FDA-1088. Where should I keep my medicine? Keep out of the reach of children. Store at room temperature between 15 and 30 degrees C (59 and 86 degrees F). Throw away any unused medicine after the expiration date. NOTE: This sheet is a summary. It may not cover all possible information. If you have questions about this medicine, talk to your doctor, pharmacist, or health care provider.  2018 Elsevier/Gold Standard (2016-09-07 14:17:49)
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
## 2017-01-31 ENCOUNTER — Ambulatory Visit (HOSPITAL_COMMUNITY): Payer: Commercial Managed Care - HMO | Admitting: Licensed Clinical Social Worker

## 2017-05-21 ENCOUNTER — Ambulatory Visit: Payer: 59 | Admitting: Family Medicine

## 2017-05-21 ENCOUNTER — Ambulatory Visit: Payer: Commercial Managed Care - HMO

## 2017-10-17 ENCOUNTER — Encounter (HOSPITAL_COMMUNITY): Payer: Self-pay

## 2017-10-17 ENCOUNTER — Emergency Department (HOSPITAL_COMMUNITY): Payer: 59

## 2017-10-17 ENCOUNTER — Other Ambulatory Visit: Payer: Self-pay

## 2017-10-17 ENCOUNTER — Emergency Department (HOSPITAL_COMMUNITY)
Admission: EM | Admit: 2017-10-17 | Discharge: 2017-10-17 | Disposition: A | Discharge status: Home / Self Care | Payer: 59 | Attending: Emergency Medicine | Admitting: Emergency Medicine

## 2017-10-17 DIAGNOSIS — Z79899 Other long term (current) drug therapy: Secondary | ICD-10-CM | POA: Diagnosis not present

## 2017-10-17 DIAGNOSIS — M545 Low back pain, unspecified: Secondary | ICD-10-CM

## 2017-10-17 DIAGNOSIS — M25512 Pain in left shoulder: Secondary | ICD-10-CM | POA: Diagnosis not present

## 2017-10-17 DIAGNOSIS — F17210 Nicotine dependence, cigarettes, uncomplicated: Secondary | ICD-10-CM | POA: Insufficient documentation

## 2017-10-17 DIAGNOSIS — F329 Major depressive disorder, single episode, unspecified: Secondary | ICD-10-CM | POA: Insufficient documentation

## 2017-10-17 DIAGNOSIS — R51 Headache: Secondary | ICD-10-CM | POA: Diagnosis present

## 2017-10-17 DIAGNOSIS — R0789 Other chest pain: Secondary | ICD-10-CM | POA: Insufficient documentation

## 2017-10-17 DIAGNOSIS — M542 Cervicalgia: Secondary | ICD-10-CM | POA: Insufficient documentation

## 2017-10-17 DIAGNOSIS — R10817 Generalized abdominal tenderness: Secondary | ICD-10-CM | POA: Insufficient documentation

## 2017-10-17 LAB — COMPREHENSIVE METABOLIC PANEL
ALBUMIN: 4.2 g/dL (ref 3.5–5.0)
ALK PHOS: 44 U/L (ref 38–126)
ALT: 21 U/L (ref 14–54)
AST: 20 U/L (ref 15–41)
Anion gap: 6 (ref 5–15)
BUN: 10 mg/dL (ref 6–20)
CHLORIDE: 106 mmol/L (ref 101–111)
CO2: 23 mmol/L (ref 22–32)
Calcium: 9.2 mg/dL (ref 8.9–10.3)
Creatinine, Ser: 0.59 mg/dL (ref 0.44–1.00)
GFR calc Af Amer: >60 mL/min (ref >60)
GFR calc non Af Amer: >60 mL/min (ref >60)
Glucose, Bld: 83 mg/dL (ref 65–99)
Potassium: 3.5 mmol/L (ref 3.5–5.1)
SODIUM: 135 mmol/L (ref 135–145)
TOTAL PROTEIN: 7.5 g/dL (ref 6.5–8.1)
Total Bilirubin: 0.3 mg/dL (ref 0.3–1.2)

## 2017-10-17 LAB — CBC
HEMATOCRIT: 34.5 % — AB (ref 36.0–46.0)
HEMOGLOBIN: 11.6 g/dL — AB (ref 12.0–15.0)
MCH: 28.2 pg (ref 26.0–34.0)
MCHC: 33.6 g/dL (ref 30.0–36.0)
MCV: 83.9 fL (ref 78.0–100.0)
Platelets: 258 10*3/uL (ref 150–400)
RBC: 4.11 MIL/uL (ref 3.87–5.11)
RDW: 13.8 % (ref 11.5–15.5)
WBC: 10 10*3/uL (ref 4.0–10.5)

## 2017-10-17 LAB — HCG, QUANTITATIVE, PREGNANCY

## 2017-10-17 LAB — LIPASE, BLOOD: LIPASE: 29 U/L (ref 11–51)

## 2017-10-17 MED ORDER — IBUPROFEN 600 MG PO TABS: 600.0000 mg | ORAL_TABLET | Freq: Four times a day (QID) | ORAL | 0 refills | Status: DC | PRN

## 2017-10-17 MED ORDER — OXYCODONE-ACETAMINOPHEN 5-325 MG PO TABS
1.0000 | ORAL_TABLET | ORAL | Status: DC | PRN
  Administered 2017-10-17: 1 via ORAL
  Filled 2017-10-17: qty 1

## 2017-10-17 MED ORDER — METHOCARBAMOL 500 MG PO TABS: 500.0000 mg | ORAL_TABLET | Freq: Two times a day (BID) | ORAL | 0 refills | Status: DC

## 2017-10-17 MED ORDER — ONDANSETRON 4 MG PO TBDP
4.0000 mg | ORAL_TABLET | Freq: Once | ORAL | Status: AC
  Administered 2017-10-17: 4 mg via ORAL
  Filled 2017-10-17: qty 1

## 2017-10-17 MED ORDER — IOPAMIDOL (ISOVUE-300) INJECTION 61%
INTRAVENOUS | Status: AC
  Filled 2017-10-17: qty 100

## 2017-10-17 MED ORDER — MORPHINE SULFATE (PF) 4 MG/ML IV SOLN
4.0000 mg | Freq: Once | INTRAVENOUS | Status: AC
  Administered 2017-10-17: 4 mg via INTRAMUSCULAR
  Filled 2017-10-17: qty 1

## 2017-10-17 MED ORDER — KETOROLAC TROMETHAMINE 30 MG/ML IJ SOLN
30.0000 mg | Freq: Once | INTRAMUSCULAR | Status: AC
  Administered 2017-10-17: 30 mg via INTRAVENOUS
  Filled 2017-10-17: qty 1

## 2017-10-17 MED ORDER — IOPAMIDOL (ISOVUE-300) INJECTION 61%
100.0000 mL | Freq: Once | INTRAVENOUS | Status: AC | PRN
  Administered 2017-10-17: 100 mL via INTRAVENOUS

## 2017-10-17 MED ORDER — METHOCARBAMOL 500 MG PO TABS
500.0000 mg | ORAL_TABLET | Freq: Once | ORAL | Status: AC
  Administered 2017-10-17: 500 mg via ORAL
  Filled 2017-10-17: qty 1

## 2017-10-17 NOTE — ED Notes (Signed)
Ace in lab to add on hcg quant
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
## 2017-10-17 NOTE — Discharge Instructions (Signed)
Imaging and labs have been very reassuring.  Pain is likely due to muscle injury and muscle strain.  Please continue to treat pain with ibuprofen as well as muscle relaxer, this medication can make you drowsy, you can combine this with Tylenol but please do not combine with any other over-the-counter pain relievers.  Please also use ice and heat to help with pain and light stretching.  It is very common for you to feel little bit worse on the day before he starts the improvement, if you are still having pain after week please follow-up with your primary care doctor.  If you have significantly worsening pain, numbness, tingling or other new or concerning symptoms please return to the ED.

## 2017-10-22 ENCOUNTER — Ambulatory Visit (INDEPENDENT_AMBULATORY_CARE_PROVIDER_SITE_OTHER): Payer: 59 | Admitting: Family Medicine

## 2017-10-22 ENCOUNTER — Encounter: Payer: Self-pay | Admitting: Family Medicine

## 2017-10-22 VITALS — BP 136/87 | HR 99 | Ht 67.0 in | Wt 209.0 lb

## 2017-10-22 DIAGNOSIS — S39012A Strain of muscle, fascia and tendon of lower back, initial encounter: Secondary | ICD-10-CM

## 2017-10-22 DIAGNOSIS — S161XXA Strain of muscle, fascia and tendon at neck level, initial encounter: Secondary | ICD-10-CM | POA: Diagnosis not present

## 2017-10-22 DIAGNOSIS — M5412 Radiculopathy, cervical region: Secondary | ICD-10-CM | POA: Diagnosis not present

## 2017-10-22 MED ORDER — PREDNISONE 5 MG (48) PO TBPK: ORAL_TABLET | ORAL | 0 refills | Status: DC

## 2017-10-22 MED ORDER — METHOCARBAMOL 500 MG PO TABS: 500.0000 mg | ORAL_TABLET | Freq: Three times a day (TID) | ORAL | 0 refills | Status: DC | PRN
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
## 2017-10-22 NOTE — Patient Instructions (Signed)
Thank you for coming in today. Take tylenol in addition to prednisone as needed for pain.  Use methocarbamol for muscle relaxer as needed.  Use a heating pad.  Use a TENS unit.  Come back or go to the emergency room if you notice new weakness new numbness problems walking or bowel or bladder problems. If you want to switch to PT let me know.  Recheck if not better.    TENS UNIT: This is helpful for muscle pain and spasm.   Search and Purchase a TENS 7000 2nd edition at  www.tenspros.com or www.Amazon.com It should be less than $30.     TENS unit instructions: Do not shower or bathe with the unit on Turn the unit off before removing electrodes or batteries If the electrodes lose stickiness add a drop of water to the electrodes after they are disconnected from the unit and place on plastic sheet. If you continued to have difficulty, call the TENS unit company to purchase more electrodes. Do not apply lotion on the skin area prior to use. Make sure the skin is clean and dry as this will help prolong the life of the electrodes. After use, always check skin for unusual red areas, rash or other skin difficulties. If there are any skin problems, does not apply electrodes to the same area. Never remove the electrodes from the unit by pulling the wires. Do not use the TENS unit or electrodes other than as directed. Do not change electrode placement without consultating your therapist or physician. Keep 2 fingers with between each electrode. Wear time ratio is 2:1, on to off times.    For example on for 30 minutes off for 15 minutes and then on for 30 minutes off for 15 minutes    Cervical Radiculopathy Cervical radiculopathy happens when a nerve in the neck (cervical nerve) is pinched or bruised. This condition can develop because of an injury or as part of the normal aging process. Pressure on the cervical nerves can cause pain or numbness that runs from the neck all the way down into the  arm and fingers. Usually, this condition gets better with rest. Treatment may be needed if the condition does not improve. What are the causes? This condition may be caused by:  Injury.  Slipped (herniated) disk.  Muscle tightness in the neck because of overuse.  Arthritis.  Breakdown or degeneration in the bones and joints of the spine (spondylosis) due to aging.  Bone spurs that may develop near the cervical nerves.  What are the signs or symptoms? Symptoms of this condition include:  Pain that runs from the neck to the arm and hand. The pain can be severe or irritating. It may be worse when the neck is moved.  Numbness or weakness in the affected arm and hand.  How is this diagnosed? This condition may be diagnosed based on symptoms, medical history, and a physical exam. You may also have tests, including:  X-rays.  CT scan.  MRI.  Electromyogram (EMG).  Nerve conduction tests.  How is this treated? In many cases, treatment is not needed for this condition. With rest, the condition usually gets better over time. If treatment is needed, options may include:  Wearing a soft neck collar for short periods of time.  Physical therapy to strengthen your neck muscles.  Medicines, such as NSAIDs, oral corticosteroids, or spinal injections.  Surgery. This may be needed if other treatments do not help. Various types of surgery may be done  depending on the cause of your problems.  Follow these instructions at home: Managing pain  Take over-the-counter and prescription medicines only as told by your health care provider.  If directed, apply ice to the affected area. ? Put ice in a plastic bag. ? Place a towel between your skin and the bag. ? Leave the ice on for 20 minutes, 2-3 times per day.  If ice does not help, you can try using heat. Take a warm shower or warm bath, or use a heat pack as told by your health care provider.  Try a gentle neck and shoulder massage  to help relieve symptoms. Activity  Rest as needed. Follow instructions from your health care provider about any restrictions on activities.  Do stretching and strengthening exercises as told by your health care provider or physical therapist. General instructions  If you were given a soft collar, wear it as told by your health care provider.  Use a flat pillow when you sleep.  Keep all follow-up visits as told by your health care provider. This is important. Contact a health care provider if:  Your condition does not improve with treatment. Get help right away if:  Your pain gets much worse and cannot be controlled with medicines.  You have weakness or numbness in your hand, arm, face, or leg.  You have a high fever.  You have a stiff, rigid neck.  You lose control of your bowels or your bladder (have incontinence).  You have trouble with walking, balance, or speaking. This information is not intended to replace advice given to you by your health care provider. Make sure you discuss any questions you have with your health care provider. Document Released: 05/29/2001 Document Revised: 02/09/2016 Document Reviewed: 10/28/2014 Elsevier Interactive Patient Education  [employer].

## 2017-12-19 ENCOUNTER — Ambulatory Visit: Payer: 59 | Admitting: Family Medicine

## 2017-12-20 ENCOUNTER — Encounter: Payer: Self-pay | Admitting: Family Medicine

## 2017-12-20 ENCOUNTER — Ambulatory Visit (INDEPENDENT_AMBULATORY_CARE_PROVIDER_SITE_OTHER): Payer: Self-pay | Admitting: Family Medicine

## 2017-12-20 DIAGNOSIS — S39012A Strain of muscle, fascia and tendon of lower back, initial encounter: Secondary | ICD-10-CM | POA: Insufficient documentation
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
## 2017-12-20 NOTE — Patient Instructions (Signed)
Thank you for coming in today. Attend PT.  If not getting better let me know and we will do an MRI Recheck with me before May 5th.

## 2017-12-27 ENCOUNTER — Ambulatory Visit: Payer: 59 | Admitting: Physical Therapy

## 2017-12-27 ENCOUNTER — Encounter: Payer: Self-pay | Admitting: Physical Therapy

## 2017-12-27 DIAGNOSIS — M545 Low back pain, unspecified: Secondary | ICD-10-CM

## 2017-12-27 DIAGNOSIS — M62830 Muscle spasm of back: Secondary | ICD-10-CM

## 2017-12-27 DIAGNOSIS — M6281 Muscle weakness (generalized): Secondary | ICD-10-CM | POA: Diagnosis not present

## 2017-12-27 NOTE — Patient Instructions (Signed)
TENS UNIT: This is helpful for muscle pain and spasm.   Search and Purchase a TENS 7000 2nd edition at www.tenspros.com. It should be less than $30.     TENS unit instructions: Do not shower or bathe with the unit on Turn the unit off before removing electrodes or batteries If the electrodes lose stickiness add a drop of water to the electrodes after they are disconnected from the unit and place on plastic sheet. If you continued to have difficulty, call the TENS unit company to purchase more electrodes. Do not apply lotion on the skin area prior to use. Make sure the skin is clean and dry as this will help prolong the life of the electrodes. After use, always check skin for unusual red areas, rash or other skin difficulties. If there are any skin problems, does not apply electrodes to the same area. Never remove the electrodes from the unit by pulling the wires. Do not use the TENS unit or electrodes other than as directed. Do not change electrode placement without consultating your therapist or physician. Keep 2 fingers with between each electrode. Wear time ratio is 2:1, on to off times.    For example on for 30 minutes off for 15 minutes and then on for 30 minutes off for 15 minutes  Trigger Point Dry Needling  . What is Trigger Point Dry Needling (DN)? o DN is a physical therapy technique used to treat muscle pain and dysfunction. Specifically, DN helps deactivate muscle trigger points (muscle knots).  o A thin filiform needle is used to penetrate the skin and stimulate the underlying trigger point. The goal is for a local twitch response (LTR) to occur and for the trigger point to relax. No medication of any kind is injected during the procedure.   . What Does Trigger Point Dry Needling Feel Like?  o The procedure feels different for each individual patient. Some patients report that they do not actually feel the needle enter the skin and overall the process is not painful. Very  mild bleeding may occur. However, many patients feel a deep cramping in the muscle in which the needle was inserted. This is the local twitch response.   . How Will I feel after the treatment? o Soreness is normal, and the onset of soreness may not occur for a few hours. Typically this soreness does not last longer than two days.  o Bruising is uncommon, however; ice can be used to decrease any possible bruising.  o In rare cases feeling tired or nauseous after the treatment is normal. In addition, your symptoms may get worse before they get better, this period will typically not last longer than 24 hours.   . What Can I do After My Treatment? o Increase your hydration by drinking more water for the next 24 hours. o You may place ice or heat on the areas treated that have become sore, however, do not use heat on inflamed or bruised areas. Heat often brings more relief post needling. o You can continue your regular activities, but vigorous activity is not recommended initially after the treatment for 24 hours. o DN is best combined with other physical therapy such as strengthening, stretching, and other therapies.     
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
## 2017-12-31 ENCOUNTER — Encounter: Payer: 59 | Admitting: Physical Therapy

## 2018-01-02 ENCOUNTER — Encounter: Payer: Self-pay | Admitting: Physical Therapy

## 2018-01-13 ENCOUNTER — Ambulatory Visit: Payer: 59

## 2018-01-13 ENCOUNTER — Encounter: Payer: 59 | Admitting: Physical Therapy

## 2018-01-14 ENCOUNTER — Ambulatory Visit: Payer: 59

## 2018-01-14 ENCOUNTER — Ambulatory Visit: Payer: 59 | Admitting: Physical Therapy

## 2018-01-14 DIAGNOSIS — M545 Low back pain, unspecified: Secondary | ICD-10-CM

## 2018-01-14 DIAGNOSIS — M62830 Muscle spasm of back: Secondary | ICD-10-CM

## 2018-01-14 DIAGNOSIS — M6281 Muscle weakness (generalized): Secondary | ICD-10-CM

## 2018-01-14 NOTE — Patient Instructions (Signed)
Sleeping on Back  Place pillow under knees. A pillow with cervical support and a roll around waist are also helpful. Copyright  VHI. All rights reserved.  Sleeping on Side Place pillow between knees. Use cervical support under neck and a roll around waist as needed. Copyright  VHI. All rights reserved.   Sleeping on Stomach   If this is the only desirable sleeping position, place pillow under lower legs, and under stomach or chest as needed.  Posture - Sitting   Sit upright, head facing forward. Try using a roll to support lower back. Keep shoulders relaxed, and avoid rounded back. Keep hips level with knees. Avoid crossing legs for long periods. Stand to Sit / Sit to Stand   To sit: Bend knees to lower self onto front edge of chair, then scoot back on seat. To stand: Reverse sequence by placing one foot forward, and scoot to front of seat. Use rocking motion to stand up.   Work Height and Reach  Ideal work height is no more than 2 to 4 inches below elbow level when standing, and at elbow level when sitting. Reaching should be limited to arm's length, with elbows slightly bent.  Bending  Bend at hips and knees, not back. Keep feet shoulder-width apart.    Posture - Standing   Good posture is important. Avoid slouching and forward head thrust. Maintain curve in low back and align ears over shoul- ders, hips over ankles.  Alternating Positions   Alternate tasks and change positions frequently to reduce fatigue and muscle tension. Take rest breaks. Computer Work   Position work to face forward. Use proper work and seat height. Keep shoulders back and down, wrists straight, and elbows at right angles. Use chair that provides full back support. Add footrest and lumbar roll as needed.  Getting Into / Out of Car  Lower self onto seat, scoot back, then bring in one leg at a time. Reverse sequence to get out.  Dressing  Lie on back to pull socks or slacks over feet, or sit  and bend leg while keeping back straight.    Housework - Sink  Place one foot on ledge of cabinet under sink when standing at sink for prolonged periods.   Pushing / Pulling  Pushing is preferable to pulling. Keep back in proper alignment, and use leg muscles to do the work.  Deep Squat   Squat and lift with both arms held against upper trunk. Tighten stomach muscles without holding breath. Use smooth movements to avoid jerking.  Avoid Twisting   Avoid twisting or bending back. Pivot around using foot movements, and bend at knees if needed when reaching for articles.  Carrying Luggage   Distribute weight evenly on both sides. Use a cart whenever possible. Do not twist trunk. Move body as a unit.   Lifting Principles .Maintain proper posture and head alignment. .Slide object as close as possible before lifting. .Move obstacles out of the way. .Test before lifting; ask for help if too heavy. .Tighten stomach muscles without holding breath. .Use smooth movements; do not jerk. .Use legs to do the work, and pivot with feet. .Distribute the work load symmetrically and close to the center of trunk. .Push instead of pull whenever possible.   Ask For Help   Ask for help and delegate to others when possible. Coordinate your movements when lifting together, and maintain the low back curve.  Log Roll   Lying on back, bend left knee and place left   arm across chest. Roll all in one movement to the right. Reverse to roll to the left. Always move as one unit. Housework - Sweeping  Use long-handled equipment to avoid stooping.   Housework - Wiping  Position yourself as close as possible to reach work surface. Avoid straining your back.  Laundry - Unloading Wash   To unload small items at bottom of washer, lift leg opposite to arm being used to reach.  Gardening - Raking  Move close to area to be raked. Use arm movements to do the work. Keep back straight and avoid  twisting.     Cart  When reaching into cart with one arm, lift opposite leg to keep back straight.   Getting Into / Out of Bed  Lower self to lie down on one side by raising legs and lowering head at the same time. Use arms to assist moving without twisting. Bend both knees to roll onto back if desired. To sit up, start from lying on side, and use same move-ments in reverse. Housework - Vacuuming  Hold the vacuum with arm held at side. Step back and forth to move it, keeping head up. Avoid twisting.   Laundry - Armed forces training and education officer so that bending and twisting can be avoided.   Laundry - Unloading Dryer  Squat down to reach into clothes dryer or use a reacher.  Gardening - Weeding / Psychiatric nurse or Kneel. Knee pads may be helpful.                   3 part core exercise    With neutral spine, tighten pelvic floor, then tighten abdominal muscles sucking your belly button to back bone, then tighten muscles in low back at waist. Hold 10 seconds. Repeat _10_ times. Do _several__ times a day.  * When you have mastered this, you can contract all 3 muscle groups at same time.   * Progress to do this activity sitting, standing, walking and functional activities.   HIP: Hamstrings - Short Sitting    Straighten leg in front of you. Keep back straight and start leaning over until stretch is felt on back of leg.   Hold _30__ seconds. _3__ reps per set, _2__ sets per day, ___ days per week
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
## 2018-01-15 ENCOUNTER — Ambulatory Visit (INDEPENDENT_AMBULATORY_CARE_PROVIDER_SITE_OTHER): Payer: 59 | Admitting: Family Medicine

## 2018-01-15 ENCOUNTER — Encounter: Payer: Self-pay | Admitting: Family Medicine

## 2018-01-15 VITALS — BP 130/77 | HR 82 | Ht 67.0 in | Wt 213.0 lb

## 2018-01-15 DIAGNOSIS — S161XXA Strain of muscle, fascia and tendon at neck level, initial encounter: Secondary | ICD-10-CM | POA: Insufficient documentation

## 2018-01-15 DIAGNOSIS — S39012A Strain of muscle, fascia and tendon of lower back, initial encounter: Secondary | ICD-10-CM | POA: Diagnosis not present

## 2018-01-15 DIAGNOSIS — S161XXD Strain of muscle, fascia and tendon at neck level, subsequent encounter: Secondary | ICD-10-CM | POA: Diagnosis not present

## 2018-01-15 MED ORDER — OMEPRAZOLE 40 MG PO CPDR: 40.0000 mg | DELAYED_RELEASE_CAPSULE | Freq: Every day | ORAL | 1 refills | Status: DC

## 2018-01-15 MED ORDER — AMITRIPTYLINE HCL 25 MG PO TABS: 25.0000 mg | ORAL_TABLET | Freq: Every day | ORAL | 0 refills | Status: DC
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
## 2018-01-15 NOTE — Patient Instructions (Signed)
Thank you for coming in today. Continue PT.  Continue home exercises.  We will reassess work restrictions at follow up before June 1st.

## 2018-01-20 ENCOUNTER — Encounter: Payer: 59 | Admitting: Physical Therapy

## 2018-01-21 ENCOUNTER — Encounter: Payer: 59 | Admitting: Physical Therapy

## 2018-02-07 ENCOUNTER — Encounter: Payer: 59 | Admitting: Physical Therapy

## 2018-02-13 ENCOUNTER — Ambulatory Visit: Payer: 59 | Admitting: Family Medicine

## 2018-02-17 ENCOUNTER — Ambulatory Visit: Payer: 59 | Admitting: Family Medicine

## 2018-02-18 ENCOUNTER — Encounter: Payer: Self-pay | Admitting: Family Medicine

## 2018-02-18 ENCOUNTER — Ambulatory Visit (INDEPENDENT_AMBULATORY_CARE_PROVIDER_SITE_OTHER): Payer: 59 | Admitting: Family Medicine

## 2018-02-18 VITALS — BP 123/80 | HR 79 | Ht 67.0 in | Wt 213.0 lb

## 2018-02-18 DIAGNOSIS — Z23 Encounter for immunization: Secondary | ICD-10-CM | POA: Diagnosis not present

## 2018-02-18 DIAGNOSIS — S161XXA Strain of muscle, fascia and tendon at neck level, initial encounter: Secondary | ICD-10-CM | POA: Diagnosis not present

## 2018-02-18 NOTE — Progress Notes (Signed)
       Cathy Lester is a 37 y.o. female who presents to [facility]: Primary Care [facility] Medicine today for back pain.  Ms. Cathy Lester presents to clinic today to recheck her neck and back pain.  She was seen in February, April, and May 1st for this issue.  She has been continuing to attend physical therapy and does note symptom improvement.  She is had considerable improvement but still occasionally have pain especially when doing household activities such as pushing an object to a high shelf or bending down to pick up an object from the ground.  She is currently at work with light duty and does not think she is able currently to go back to work with full duty.  She thinks she is will be able to return to work around July 8.   She denies any radiating pain weakness or numbness.  She notes stiffness as well in her back.  Her symptoms are worse with sitting for prolonged period of time or standing or flexion or lifting.   She is tried over-the-counter medications for pain control which have not helped very much.   Past medical surgical social history reviewed.  Medications and allergies reviewed.  Review of systems unremarkable except were noted above.  Exam:  BP 123/80   Pulse 79   Ht 5\' 7"  (1.702 m)   Wt 213 lb (96.6 kg)   BMI 33.36 kg/m  Gen: Well NAD HEENT: EOMI,  MMM Lungs: Normal work of breathing. CTABL Heart: RRR no MRG Abd: NABS, Soft. Nondistended, Nontender Exts: Brisk capillary refill, warm and well perfused.  C-spine: Nontender to spinal midline.  Tender palpation left trapezius. Cervical motion normal     Assessment and Plan: 37 y.o. female with trapezius pain.  Pain has significantly improved but is still somewhat present.  Patient has a physically demanding job.  Plan to continue currently due to her light duty restrictions while advancing activity at home. RTW full duty July 8th.   Schedule  well woman with pap after 37th birthday [birth year].  Tdap given today.    Discussed warning signs or symptoms. Please see discharge instructions. Patient expresses understanding.  I spent 15 minutes with this patient, greater than 50% was face-to-face time counseling regarding treatment plan.

## 2018-02-18 NOTE — Patient Instructions (Addendum)
Thank you for coming in today. Continue light duty.  Return to full Duty on July 8th.  Return sooner if this will not happen.  Schedule well adult exam with pap smear after your birthday.   We will do Tdap vaccine today.   Continue home exercises.

## 2018-03-26 ENCOUNTER — Ambulatory Visit (INDEPENDENT_AMBULATORY_CARE_PROVIDER_SITE_OTHER): Payer: 59 | Admitting: Family Medicine

## 2018-03-26 ENCOUNTER — Encounter: Payer: Self-pay | Admitting: Family Medicine

## 2018-03-26 VITALS — BP 119/82 | HR 102 | Ht 67.0 in | Wt 211.0 lb

## 2018-03-26 DIAGNOSIS — F411 Generalized anxiety disorder: Secondary | ICD-10-CM | POA: Diagnosis not present

## 2018-03-26 DIAGNOSIS — G4700 Insomnia, unspecified: Secondary | ICD-10-CM

## 2018-03-26 DIAGNOSIS — M542 Cervicalgia: Secondary | ICD-10-CM

## 2018-03-26 MED ORDER — SERTRALINE HCL 50 MG PO TABS: ORAL_TABLET | ORAL | 1 refills | Status: DC

## 2018-03-26 MED ORDER — AMITRIPTYLINE HCL 25 MG PO TABS: 25.0000 mg | ORAL_TABLET | Freq: Every evening | ORAL | 0 refills | Status: DC | PRN
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
## 2018-03-26 NOTE — Patient Instructions (Signed)
Thank you for coming in today. Start zoloft 1/2 pill daily.  Increase to 1 pill in 2 weeks.  Let me know if you have problems.  Return sooner if needed.  Use amatrytpline at bedtime as needed for sleep or pain.

## 2018-03-27 ENCOUNTER — Encounter: Payer: Self-pay | Admitting: Family Medicine

## 2018-04-14 ENCOUNTER — Ambulatory Visit: Payer: 59 | Admitting: Family Medicine

## 2018-05-06 ENCOUNTER — Ambulatory Visit (INDEPENDENT_AMBULATORY_CARE_PROVIDER_SITE_OTHER): Payer: 59 | Admitting: Family Medicine

## 2018-05-06 ENCOUNTER — Encounter: Payer: Self-pay | Admitting: Family Medicine

## 2018-05-06 VITALS — BP 118/84 | HR 83 | Ht 67.0 in | Wt 203.0 lb

## 2018-05-06 DIAGNOSIS — F411 Generalized anxiety disorder: Secondary | ICD-10-CM

## 2018-05-06 DIAGNOSIS — G4700 Insomnia, unspecified: Secondary | ICD-10-CM | POA: Diagnosis not present

## 2018-05-06 DIAGNOSIS — Z23 Encounter for immunization: Secondary | ICD-10-CM

## 2018-05-06 MED ORDER — AMITRIPTYLINE HCL 75 MG PO TABS: 75.0000 mg | ORAL_TABLET | Freq: Every evening | ORAL | 0 refills | Status: DC | PRN

## 2018-05-06 MED ORDER — SERTRALINE HCL 100 MG PO TABS: 100.0000 mg | ORAL_TABLET | Freq: Every day | ORAL | 1 refills | Status: DC
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
## 2018-05-06 NOTE — Patient Instructions (Addendum)
Thank you for coming in today. Increase the amitrytpline to 75mg  at night.  Increase zoloft to 100mg  at night.  Send me an update in about 2 weeks with how your mood is doing.  Recheck in person in about 1 month.   Flu shot given today.

## 2018-06-11 ENCOUNTER — Encounter: Payer: Self-pay | Admitting: Family Medicine

## 2018-06-11 ENCOUNTER — Ambulatory Visit: Payer: 59 | Admitting: Family Medicine

## 2018-06-11 ENCOUNTER — Ambulatory Visit (INDEPENDENT_AMBULATORY_CARE_PROVIDER_SITE_OTHER): Payer: 59 | Admitting: Family Medicine

## 2018-06-11 VITALS — BP 126/84 | HR 96 | Ht 67.0 in | Wt 206.0 lb

## 2018-06-11 DIAGNOSIS — F329 Major depressive disorder, single episode, unspecified: Secondary | ICD-10-CM

## 2018-06-11 DIAGNOSIS — F321 Major depressive disorder, single episode, moderate: Secondary | ICD-10-CM | POA: Diagnosis not present

## 2018-06-11 DIAGNOSIS — Z8659 Personal history of other mental and behavioral disorders: Secondary | ICD-10-CM | POA: Insufficient documentation

## 2018-06-11 DIAGNOSIS — F411 Generalized anxiety disorder: Secondary | ICD-10-CM

## 2018-06-11 MED ORDER — FLUOXETINE HCL 20 MG PO CAPS: 20.0000 mg | ORAL_CAPSULE | Freq: Every day | ORAL | 3 refills | Status: DC

## 2018-06-11 MED ORDER — AMITRIPTYLINE HCL 75 MG PO TABS: 75.0000 mg | ORAL_TABLET | Freq: Every evening | ORAL | 2 refills | Status: DC | PRN

## 2018-06-11 NOTE — Patient Instructions (Addendum)
Thank you for coming in today. STOP Zoloft.  Start Prozac tomorrow.  Recheck in 2-3 weeks. If not improving fatigue and energy symptoms we will add Wellbutrin.   Reschedule pap for about 2 weeks.   Fluoxetine capsules or tablets (Depression/Mood Disorders) What is this medicine? FLUOXETINE (floo OX e teen) belongs to a class of drugs known as selective serotonin reuptake inhibitors (SSRIs). It helps to treat mood problems such as depression, obsessive compulsive disorder, and panic attacks. It can also treat certain eating disorders. This medicine may be used for other purposes; ask your health care provider or pharmacist if you have questions. COMMON BRAND NAME(S): Prozac What should I tell my health care provider before I take this medicine? They need to know if you have any of these conditions: -bipolar disorder or a family history of bipolar disorder -bleeding disorders -glaucoma -heart disease -liver disease -low levels of sodium in the blood -seizures -suicidal thoughts, plans, or attempt; a previous suicide attempt by you or a family member -take MAOIs like Carbex, Eldepryl, Marplan, Nardil, and Parnate -take medicines that treat or prevent blood clots -thyroid disease -an unusual or allergic reaction to fluoxetine, other medicines, foods, dyes, or preservatives -pregnant or trying to get pregnant -breast-feeding How should I use this medicine? Take this medicine by mouth with a glass of water. Follow the directions on the prescription label. You can take this medicine with or without food. Take your medicine at regular intervals. Do not take it more often than directed. Do not stop taking this medicine suddenly except upon the advice of your doctor. Stopping this medicine too quickly may cause serious side effects or your condition may worsen. A special MedGuide will be given to you by the pharmacist with each prescription and refill. Be sure to read this information carefully  each time. Talk to your pediatrician regarding the use of this medicine in children. While this drug may be prescribed for children as young as 7 years for selected conditions, precautions do apply. Overdosage: If you think you have taken too much of this medicine contact a poison control center or emergency room at once. NOTE: This medicine is only for you. Do not share this medicine with others. What if I miss a dose? If you miss a dose, skip the missed dose and go back to your regular dosing schedule. Do not take double or extra doses. What may interact with this medicine? Do not take this medicine with any of the following medications: -other medicines containing fluoxetine, like Sarafem or Symbyax -cisapride -linezolid -MAOIs like Carbex, Eldepryl, Marplan, Nardil, and Parnate -methylene blue (injected into a vein) -pimozide -thioridazine This medicine may also interact with the following medications: -alcohol -amphetamines -aspirin and aspirin-like medicines -carbamazepine -certain medicines for depression, anxiety, or psychotic disturbances -certain medicines for migraine headaches like almotriptan, eletriptan, frovatriptan, naratriptan, rizatriptan, sumatriptan, zolmitriptan -digoxin -diuretics -fentanyl -flecainide -furazolidone -isoniazid -lithium -medicines for sleep -medicines that treat or prevent blood clots like warfarin, enoxaparin, and dalteparin -NSAIDs, medicines for pain and inflammation, like ibuprofen or naproxen -phenytoin -procarbazine -propafenone -rasagiline -ritonavir -supplements like St. John's wort, kava kava, valerian -tramadol -tryptophan -vinblastine This list may not describe all possible interactions. Give your health care provider a list of all the medicines, herbs, non-prescription drugs, or dietary supplements you use. Also tell them if you smoke, drink alcohol, or use illegal drugs. Some items may interact with your medicine. What  should I watch for while using this medicine? Tell your doctor if your  symptoms do not get better or if they get worse. Visit your doctor or health care professional for regular checks on your progress. Because it may take several weeks to see the full effects of this medicine, it is important to continue your treatment as prescribed by your doctor. Patients and their families should watch out for new or worsening thoughts of suicide or depression. Also watch out for sudden changes in feelings such as feeling anxious, agitated, panicky, irritable, hostile, aggressive, impulsive, severely restless, overly excited and hyperactive, or not being able to sleep. If this happens, especially at the beginning of treatment or after a change in dose, call your health care professional. Bonita QuinYou may get drowsy or dizzy. Do not drive, use machinery, or do anything that needs mental alertness until you know how this medicine affects you. Do not stand or sit up quickly, especially if you are an older patient. This reduces the risk of dizzy or fainting spells. Alcohol may interfere with the effect of this medicine. Avoid alcoholic drinks. Your mouth may get dry. Chewing sugarless gum or sucking hard candy, and drinking plenty of water may help. Contact your doctor if the problem does not go away or is severe. This medicine may affect blood sugar levels. If you have diabetes, check with your doctor or health care professional before you change your diet or the dose of your diabetic medicine. What side effects may I notice from receiving this medicine? Side effects that you should report to your doctor or health care professional as soon as possible: -allergic reactions like skin rash, itching or hives, swelling of the face, lips, or tongue -anxious -black, tarry stools -breathing problems -changes in vision -confusion -elevated mood, decreased need for sleep, racing thoughts, impulsive behavior -eye pain -fast, irregular  heartbeat -feeling faint or lightheaded, falls -feeling agitated, angry, or irritable -hallucination, loss of contact with reality -loss of balance or coordination -loss of memory -painful or prolonged erections -restlessness, pacing, inability to keep still -seizures -stiff muscles -suicidal thoughts or other mood changes -trouble sleeping -unusual bleeding or bruising -unusually weak or tired -vomiting Side effects that usually do not require medical attention (report to your doctor or health care professional if they continue or are bothersome): -change in appetite or weight -change in sex drive or performance -diarrhea -dry mouth -headache -increased sweating -nausea -tremors This list may not describe all possible side effects. Call your doctor for medical advice about side effects. You may report side effects to FDA at 1-800-FDA-1088. Where should I keep my medicine? Keep out of the reach of children. Store at room temperature between 15 and 30 degrees C (59 and 86 degrees F). Throw away any unused medicine after the expiration date. NOTE: This sheet is a summary. It may not cover all possible information. If you have questions about this medicine, talk to your doctor, pharmacist, or health care provider.  2018 Elsevier/Gold Standard (2016-02-04 15:55:27)

## 2018-06-11 NOTE — Progress Notes (Signed)
Zaynab Chipman is a 37 y.o. female who presents to [name] today for follow up anxiety.   Infinity has been seen several times for depression and anxiety. She was started on Zoloft and titrated to 100mg  at the last visit Aug 20th.  She notes in the interim she had some upset stomach with the higher dose of Zoloft and went back to 50 mg.  She notes her anxiety and depression symptoms are not well controlled.  She notes continued feeling loss of pleasure or interest in doing things down depressed and hopeless liquid to a sleep and having little energy or feeling tired.  Additionally she notes being restless and feeling afraid as if something awful might happen.  She denies any SI or HI.  She notes her job remains a significant source of stress.  She notes that she is never been on other any medicines for anxiety or depression aside from the Zoloft.  She was originally scheduled for a Pap smear today but is having a menstrual period I would like to delay that if possible.   ROS as above:  Exam:  BP 126/84   Pulse 96   Ht 5\' 7"  (1.702 m)   Wt 206 lb (93.4 kg)   BMI 32.26 kg/m  Wt Readings from Last 5 Encounters:  06/11/18 206 lb (93.4 kg)  05/06/18 203 lb (92.1 kg)  03/26/18 211 lb (95.7 kg)  02/18/18 213 lb (96.6 kg)  01/15/18 213 lb (96.6 kg)    Gen: Well NAD HEENT: EOMI,  MMM Lungs: Normal work of breathing. CTABL Heart: RRR no MRG Abd: NABS, Soft. Nondistended, Nontender Exts: Brisk capillary refill, warm and well perfused.  Psych alert and oriented normal speech thought process and affect. Depression screen [facility] 2/9 06/11/2018 05/06/2018 03/26/2018 12/26/2016 08/13/2016  Decreased Interest 2 2 1 3 2   Down, Depressed, Hopeless 2 0 1 3 0  PHQ - 2 Score 4 2 2 6 2   Altered sleeping 3 3 2 3 3   Tired, decreased energy 2 2 1 3 3   Change in appetite 2 1 1 3 3   Feeling  bad or failure about yourself  2 0 0 1 0  Trouble concentrating 0 0 0 3 0  Moving slowly or fidgety/restless 1 0 0 2 1  Suicidal thoughts 0 0 0 0 0  PHQ-9 Score 14 8 6 21 12   Difficult doing work/chores Somewhat difficult Somewhat difficult Somewhat difficult - -   GAD 7 : Generalized Anxiety Score 06/11/2018 05/06/2018 03/26/2018  Nervous, Anxious, on Edge 1 2 1   Control/stop worrying 1 2 1   Worry too much - different things 1 2 1   Trouble relaxing 1 2 3   Restless 2 0 1  Easily annoyed or irritable 1 2 1   Afraid - awful might happen 2 0 3  Total GAD 7 Score 9 10 11   Anxiety Difficulty Somewhat difficult Somewhat difficult Extremely difficult        Assessment and Plan: 37 y.o. female with  Anxiety and depression: Worsening.  Patient meets criteria for major depressive disorder as well as generalized anxiety disorder.  Plan to discontinue Zoloft as it does not seem to be very effective.  Plan to do trial of Prozac and recheck in about 2 weeks.  If depressive symptoms are not improving a little bit likely will augment with Wellbutrin. Recheck sooner if needed.  Additionally will reschedule Pap smear for about 2 weeks  now.   No orders of the defined types were placed in this encounter.  No orders of the defined types were placed in this encounter.    Historical information moved to improve visibility of documentation.  Past Medical History:  Diagnosis Date  . Colitis    No past surgical history on file. Social History   Tobacco Use  . Smoking status: Current Every Day Smoker    Packs/day: 0.25    Types: Cigarettes  . Smokeless tobacco: Never Used  Substance Use Topics  . Alcohol use: No    Alcohol/week: 0.0 standard drinks   family history is not on file.  Medications: Current Outpatient Medications  Medication Sig Dispense Refill  . amitriptyline (ELAVIL) 75 MG tablet Take 1 tablet (75 mg total) by mouth at bedtime as needed for sleep. 90 tablet 0  . omeprazole  (PRILOSEC) 40 MG capsule Take 1 capsule (40 mg total) by mouth daily. 90 capsule 1  . sertraline (ZOLOFT) 100 MG tablet Take 1 tablet (100 mg total) by mouth daily. 30 tablet 1   No current facility-administered medications for this visit.    Allergies  Allergen Reactions  . Morphine And Related Nausea And Vomiting  . Carafate [Sucralfate] Rash    Rash     Discussed warning signs or symptoms. Please see discharge instructions. Patient expresses understanding.

## 2018-06-22 ENCOUNTER — Encounter (HOSPITAL_COMMUNITY): Payer: Self-pay | Admitting: Nurse Practitioner

## 2018-06-22 ENCOUNTER — Emergency Department (HOSPITAL_COMMUNITY)
Admission: EM | Admit: 2018-06-22 | Discharge: 2018-06-22 | Disposition: A | Discharge status: Home / Self Care | Payer: POS | Attending: Emergency Medicine | Admitting: Emergency Medicine

## 2018-06-22 DIAGNOSIS — F439 Reaction to severe stress, unspecified: Secondary | ICD-10-CM | POA: Diagnosis not present

## 2018-06-22 DIAGNOSIS — R1084 Generalized abdominal pain: Secondary | ICD-10-CM | POA: Insufficient documentation

## 2018-06-22 DIAGNOSIS — Z79899 Other long term (current) drug therapy: Secondary | ICD-10-CM | POA: Diagnosis not present

## 2018-06-22 DIAGNOSIS — F17210 Nicotine dependence, cigarettes, uncomplicated: Secondary | ICD-10-CM | POA: Diagnosis not present

## 2018-06-22 DIAGNOSIS — R109 Unspecified abdominal pain: Secondary | ICD-10-CM | POA: Diagnosis present

## 2018-06-22 DIAGNOSIS — R51 Headache: Secondary | ICD-10-CM | POA: Insufficient documentation

## 2018-06-22 LAB — CBC WITH DIFFERENTIAL/PLATELET
BASOS PCT: 0 %
Basophils Absolute: 0 10*3/uL (ref 0.0–0.1)
Eosinophils Absolute: 0.1 10*3/uL (ref 0.0–0.7)
Eosinophils Relative: 1 %
HEMATOCRIT: 36.9 % (ref 36.0–46.0)
HEMOGLOBIN: 12 g/dL (ref 12.0–15.0)
LYMPHS PCT: 28 %
Lymphs Abs: 2 10*3/uL (ref 0.7–4.0)
MCH: 28 pg (ref 26.0–34.0)
MCHC: 32.5 g/dL (ref 30.0–36.0)
MCV: 86 fL (ref 78.0–100.0)
Monocytes Absolute: 0.6 10*3/uL (ref 0.1–1.0)
Monocytes Relative: 9 %
NEUTROS ABS: 4.5 10*3/uL (ref 1.7–7.7)
NEUTROS PCT: 62 %
Platelets: 269 10*3/uL (ref 150–400)
RBC: 4.29 MIL/uL (ref 3.87–5.11)
RDW: 14.3 % (ref 11.5–15.5)
WBC: 7.1 10*3/uL (ref 4.0–10.5)

## 2018-06-22 LAB — URINALYSIS, ROUTINE W REFLEX MICROSCOPIC
Bilirubin Urine: NEGATIVE
GLUCOSE, UA: NEGATIVE mg/dL
HGB URINE DIPSTICK: NEGATIVE
KETONES UR: NEGATIVE mg/dL
LEUKOCYTES UA: NEGATIVE
Nitrite: NEGATIVE
PH: 9 — AB (ref 5.0–8.0)
Protein, ur: NEGATIVE mg/dL
Specific Gravity, Urine: 1.006 (ref 1.005–1.030)

## 2018-06-22 LAB — COMPREHENSIVE METABOLIC PANEL
ALK PHOS: 44 U/L (ref 38–126)
ALT: 22 U/L (ref 0–44)
AST: 24 U/L (ref 15–41)
Albumin: 3.7 g/dL (ref 3.5–5.0)
Anion gap: 8 (ref 5–15)
BUN: 8 mg/dL (ref 6–20)
CO2: 25 mmol/L (ref 22–32)
CREATININE: 0.72 mg/dL (ref 0.44–1.00)
Calcium: 9 mg/dL (ref 8.9–10.3)
Chloride: 108 mmol/L (ref 98–111)
GFR calc Af Amer: >60 mL/min (ref >60)
GLUCOSE: 83 mg/dL (ref 70–99)
POTASSIUM: 4 mmol/L (ref 3.5–5.1)
Sodium: 141 mmol/L (ref 135–145)
Total Bilirubin: 0.6 mg/dL (ref 0.3–1.2)
Total Protein: 7 g/dL (ref 6.5–8.1)

## 2018-06-22 LAB — LIPASE, BLOOD: Lipase: 26 U/L (ref 11–51)

## 2018-06-22 LAB — POC URINE PREG, ED: Preg Test, Ur: NEGATIVE

## 2018-06-22 MED ORDER — KETOROLAC TROMETHAMINE 30 MG/ML IJ SOLN
30.0000 mg | Freq: Once | INTRAMUSCULAR | Status: AC
  Administered 2018-06-22: 30 mg via INTRAVENOUS
  Filled 2018-06-22: qty 1

## 2018-06-22 NOTE — ED Triage Notes (Signed)
Pt is c/o "an upset stomach, explaining it as abdominal pain." She also reports associated work related stress and headaches, so much so that she was evaluated by her PCP, started on Prozac, omeprazole and amitriptyline to help with her symptoms, but she is concerned she is only but getting worse.

## 2018-06-22 NOTE — ED Provider Notes (Signed)
[facility]-EMERGENCY DEPT Provider Note   CSN: [id number] Arrival date & time: 06/22/18  2113     History   Chief Complaint Chief Complaint  Patient presents with  . Abdominal Pain    HPI Cathy Lester is a 37 y.o. female.  The history is provided by the patient and medical records. No language interpreter was used.  Abdominal Pain       37 year old female presenting for evaluation of abdominal pain and headache.  Patient states she is going through a lot of stress, most significant due to her work.  States that she has been having conflict with her I have discussed assessment and plan with my attending, who acknowledge and in agreement with plan for the past few weeks and she works third shift.  States lately she has been having recurrent throbbing frontal headache as well as having abdominal cramping.  Today she missed work due to her abdominal discomfort and headache.  Headache is 9 out of 10, persistent has been ongoing for the past 3 days.  Abdominal cramping is waxing and waning usually worse after eating and states she has to use the bathroom immediately.  She attributed to irritable bowel syndrome that she had in the past.  Abdominal pain is rated a 7 out of 10.  No associated fever, chills, light or sound sensitivity, neck stiffness, nausea vomiting diarrhea, dysuria or rash.  Patient was last seen on 9/25 at her PCPs office for similar complaint.  She endorsed having upset stomach when she takes higher dose of Zoloft and also report anxiety and depression without SI or HI.  She mentioned his symptoms today similar to symptoms she had in the past.  Past Medical History:  Diagnosis Date  . Colitis     Patient Active Problem List   Diagnosis Date Noted  . MDD (major depressive disorder) 06/11/2018  . GAD (generalized anxiety disorder) 03/26/2018  . Insomnia 08/13/2016  . Fatigue 02/07/2016  . Colitis 07/22/2015    History reviewed. No pertinent  surgical history.   OB History   None      Home Medications    Prior to Admission medications   Medication Sig Start Date End Date Taking? Authorizing Provider  amitriptyline (ELAVIL) 75 MG tablet Take 1 tablet (75 mg total) by mouth at bedtime as needed for sleep. 08/05/18   Rodolph Bong, MD  FLUoxetine (PROZAC) 20 MG capsule Take 1 capsule (20 mg total) by mouth daily. 06/11/18   Rodolph Bong, MD  omeprazole (PRILOSEC) 40 MG capsule Take 1 capsule (40 mg total) by mouth daily. 01/15/18   Rodolph Bong, MD    Family History No family history on file.  Social History Social History   Tobacco Use  . Smoking status: Current Every Day Smoker    Packs/day: 0.25    Types: Cigarettes  . Smokeless tobacco: Never Used  Substance Use Topics  . Alcohol use: No    Alcohol/week: 0.0 standard drinks  . Drug use: No     Allergies   Morphine and related and Carafate [sucralfate]   Review of Systems Review of Systems  Gastrointestinal: Positive for abdominal pain.  All other systems reviewed and are negative.    Physical Exam Updated Vital Signs BP (!) 132/92 (BP Location: Left Arm)   Pulse 100   Temp 98.8 F (37.1 C) (Oral)   Resp 16   Ht 5\' 7"  (1.702 m)   Wt 81.6 kg   SpO2 100%  BMI 28.19 kg/m   Physical Exam  Constitutional: She is oriented to person, place, and time. She appears well-developed and well-nourished. No distress.  HENT:  Head: Atraumatic.  Eyes: Conjunctivae are normal.  Neck: Neck supple.  No nuchal rigidity  Cardiovascular: Normal rate and regular rhythm.  Pulmonary/Chest: Effort normal and breath sounds normal.  Abdominal: Soft. Normal appearance. There is no tenderness (No significant focal point tenderness on palpation.). There is no tenderness at McBurney's point and negative Murphy's sign.  Neurological: She is alert and oriented to person, place, and time. She has normal strength. No cranial nerve deficit or sensory deficit. GCS eye  subscore is 4. GCS verbal subscore is 5. GCS motor subscore is 6.  Skin: No rash noted.  Psychiatric: She has a normal mood and affect.  Nursing note and vitals reviewed.    ED Treatments / Results  Labs (all labs ordered are listed, but only abnormal results are displayed) Labs Reviewed  URINALYSIS, ROUTINE W REFLEX MICROSCOPIC - Abnormal; Notable for the following components:      Result Value   Color, Urine STRAW (*)    pH 9.0 (*)    All other components within normal limits  CBC WITH DIFFERENTIAL/PLATELET  COMPREHENSIVE METABOLIC PANEL  LIPASE, BLOOD  POC URINE PREG, ED    EKG None  Radiology No results found.  Procedures Procedures (including critical care time)  Medications Ordered in ED Medications  ketorolac (TORADOL) 30 MG/ML injection 30 mg (30 mg Intravenous Given 06/22/18 2229)     Initial Impression / Assessment and Plan / ED Course  I have reviewed the triage vital signs and the nursing notes.  Pertinent labs & imaging results that were available during my care of the patient were reviewed by me and considered in my medical decision making (see chart for details).     BP (!) 132/92 (BP Location: Left Arm)   Pulse 100   Temp 98.8 F (37.1 C) (Oral)   Resp 16   Ht 5\' 7"  (1.702 m)   Wt 81.6 kg   SpO2 100%   BMI 28.19 kg/m    Final Clinical Impressions(s) / ED Diagnoses   Final diagnoses:  Generalized abdominal pain  Stress    ED Discharge Orders    None     10:23 PM Patient here with persistent headache, as well as recurrent abdominal discomfort usually after eating or when she experienced stress.  Patient felt that a lot of the symptoms related to stress which I agree.  She has a fairly benign abdominal exam, she has no nuchal rigidity concerning for meningitis.  No acute onset thunderclap headache concerning for subarachnoid hemorrhage, no focal neuro deficit on exam to suggest stroke or space-occupying lesion.  Will perform screening  exam, Toradol given for headache.  11:07 PM pregnancy test is negative, urine shows no signs of urinary tract infection, normal WBC, normal H&H, normal lipase, labs are otherwise reassuring and her abdominal exam without concerning feature.  Pain improves with toradol.  Pt stable for discharge.  Work note provided.    Fayrene Helper, PA-C 06/22/18 2311    Sabas Sous, MD 06/22/18 [phone]

## 2018-07-01 ENCOUNTER — Ambulatory Visit: Payer: 59 | Admitting: Family Medicine

## 2018-07-02 ENCOUNTER — Telehealth: Payer: Self-pay | Admitting: Family Medicine

## 2018-07-02 ENCOUNTER — Ambulatory Visit (INDEPENDENT_AMBULATORY_CARE_PROVIDER_SITE_OTHER): Payer: 59 | Admitting: Family Medicine

## 2018-07-02 ENCOUNTER — Encounter: Payer: Self-pay | Admitting: Family Medicine

## 2018-07-02 ENCOUNTER — Other Ambulatory Visit (HOSPITAL_COMMUNITY)
Admission: RE | Admit: 2018-07-02 | Discharge: 2018-07-02 | Disposition: A | Discharge status: Home / Self Care | Payer: POS | Source: Ambulatory Visit | Attending: Family Medicine | Admitting: Family Medicine

## 2018-07-02 VITALS — BP 120/76 | HR 85 | Ht 67.0 in | Wt 210.0 lb

## 2018-07-02 DIAGNOSIS — Z113 Encounter for screening for infections with a predominantly sexual mode of transmission: Secondary | ICD-10-CM | POA: Diagnosis not present

## 2018-07-02 DIAGNOSIS — Z124 Encounter for screening for malignant neoplasm of cervix: Secondary | ICD-10-CM | POA: Diagnosis not present

## 2018-07-02 DIAGNOSIS — K529 Noninfective gastroenteritis and colitis, unspecified: Secondary | ICD-10-CM

## 2018-07-02 DIAGNOSIS — Z01419 Encounter for gynecological examination (general) (routine) without abnormal findings: Secondary | ICD-10-CM | POA: Diagnosis present

## 2018-07-02 NOTE — Patient Instructions (Signed)
Thank you for coming in today. I will get results to you ASAP.  I will let you know when I have completed the FMLA form.  Let me know if you do not hear from [facility].

## 2018-07-02 NOTE — Progress Notes (Signed)
Cathy Lester is a 37 y.o. female who presents to [name]: [facility] today for well adult visit.  *He is doing reasonably well.  She takes medication listed below with no issues.  She does note however recently she had an episode of abdominal discomfort consistent with previous episodes of colitis.  She had partial work-up for this issue in 2016 and was somewhat lost to follow-up with gastroenterology.  She is done well until recently and notes that she needs to follow back up with her gastroenterologist.  She is also due for Pap smear today.  She denies any vaginal discharge or bleeding. ROS as above:  Past Medical History:  Diagnosis Date  . Colitis    No past surgical history on file. Social History   Tobacco Use  . Smoking status: Current Every Day Smoker    Packs/day: 0.25    Types: Cigarettes  . Smokeless tobacco: Never Used  Substance Use Topics  . Alcohol use: No    Alcohol/week: 0.0 standard drinks   family history is not on file.  Medications: Current Outpatient Medications  Medication Sig Dispense Refill  . [START ON 08/05/2018] amitriptyline (ELAVIL) 75 MG tablet Take 1 tablet (75 mg total) by mouth at bedtime as needed for sleep. 90 tablet 2  . FLUoxetine (PROZAC) 20 MG capsule Take 1 capsule (20 mg total) by mouth daily. 30 capsule 3  . omeprazole (PRILOSEC) 40 MG capsule Take 1 capsule (40 mg total) by mouth daily. 90 capsule 1   No current facility-administered medications for this visit.    Allergies  Allergen Reactions  . Morphine And Related Nausea And Vomiting  . Carafate [Sucralfate] Rash    Rash    Health Maintenance Health Maintenance  Topic Date Due  . PAP SMEAR  06/07/2004  . TETANUS/TDAP  02/19/2028  . INFLUENZA VACCINE  Completed  . HIV Screening  Completed     Exam:  BP 120/76   Pulse 85   Ht 5\' 7"  (1.702 m)   Wt 210 lb  (95.3 kg)   BMI 32.89 kg/m  Wt Readings from Last 5 Encounters:  07/02/18 210 lb (95.3 kg)  06/22/18 180 lb (81.6 kg)  06/11/18 206 lb (93.4 kg)  05/06/18 203 lb (92.1 kg)  03/26/18 211 lb (95.7 kg)      Gen: Well NAD HEENT: EOMI,  MMM Lungs: Normal work of breathing. CTABL Heart: RRR no MRG Abd: NABS, Soft. Nondistended, Nontender Exts: Brisk capillary refill, warm and well perfused.  Psych: Alert and oriented normal speech thought process and affect. GYN: Normal external genitalia.  Vaginal canal with mild thin white discharge.  Normal-appearing cervix.  Nontender.      Assessment and Plan: 37 y.o. female with  Well adult.  Doing reasonably well. Cervical cancer screening obtained today.  We will go ahead and do Chlamydia gonorrhea screening with Pap smear.  Additional refer to gastroenterology to follow-up colitis.  Continue to work on weight loss and smoking cessation.  Recheck in a few months if all is well.  Return sooner if needed.  FMLA form completed   Orders Placed This Encounter  Procedures  . Ambulatory referral to Gastroenterology    Referral Priority:   Routine    Referral Type:   Consultation    Referral Reason:   Specialty Services Required    Number of Visits Requested:   1   No orders of the defined types were placed  in this encounter.    Discussed warning signs or symptoms. Please see discharge instructions. Patient expresses understanding.

## 2018-07-02 NOTE — Telephone Encounter (Signed)
FMLA form completed and faxed.  Original mail back to you.

## 2018-07-03 NOTE — Telephone Encounter (Signed)
Spoke to patient  Advised her that paperwork has been faxed a copy has been mailed to patient. Rhonda Cunningham,CMA

## 2018-07-04 LAB — CYTOLOGY - PAP
Chlamydia: NEGATIVE
Diagnosis: NEGATIVE
HPV (WINDOPATH): NOT DETECTED
NEISSERIA GONORRHEA: NEGATIVE

## 2018-08-06 ENCOUNTER — Encounter: Payer: Self-pay | Admitting: Family Medicine

## 2018-08-06 ENCOUNTER — Ambulatory Visit (INDEPENDENT_AMBULATORY_CARE_PROVIDER_SITE_OTHER): Payer: POS | Admitting: Family Medicine

## 2018-08-06 VITALS — BP 119/70 | HR 113 | Ht 67.0 in | Wt 214.0 lb

## 2018-08-06 DIAGNOSIS — F411 Generalized anxiety disorder: Secondary | ICD-10-CM

## 2018-08-06 DIAGNOSIS — G4700 Insomnia, unspecified: Secondary | ICD-10-CM | POA: Diagnosis not present

## 2018-08-06 DIAGNOSIS — F321 Major depressive disorder, single episode, moderate: Secondary | ICD-10-CM

## 2018-08-06 MED ORDER — AMITRIPTYLINE HCL 75 MG PO TABS: 75.0000 mg | ORAL_TABLET | Freq: Every evening | ORAL | 2 refills | Status: DC | PRN

## 2018-08-06 MED ORDER — FLUOXETINE HCL 40 MG PO CAPS: 40.0000 mg | ORAL_CAPSULE | Freq: Every day | ORAL | 1 refills | Status: DC

## 2018-08-06 NOTE — Progress Notes (Signed)
Cathy Lester is a 37 y.o. female who presents to [facility]: Primary Care [facility] today for follow-up depression and anxiety symptoms.  Cathy Lester notes increased job stress and notes that that is causing increased life stress.  She has hired an Pensions consultant.  She notes significant worry and trouble sleeping.  She currently is taking 20 mg of fluoxetine daily and [place] a trip to [place] at bedtime.  This has helped her sleep a bit.  She is still able to work.  Cathy Lester continues to smoke.  She is not ready to quit right now.  She currently smokes about 1/2 pack/day  ROS as above:  Exam:  BP 119/70   Pulse (!) 113   Ht 5\' 7"  (1.702 m)   Wt 214 lb (97.1 kg)   BMI 33.52 kg/m  Wt Readings from Last 5 Encounters:  08/06/18 214 lb (97.1 kg)  07/02/18 210 lb (95.3 kg)  06/22/18 180 lb (81.6 kg)  06/11/18 206 lb (93.4 kg)  05/06/18 203 lb (92.1 kg)    Gen: Well NAD HEENT: EOMI,  MMM Lungs: Normal work of breathing. CTABL Heart: RRR no MRG Abd: NABS, Soft. Nondistended, Nontender Exts: Brisk capillary refill, warm and well perfused.  Psych alert and oriented normal speech thought process and affect.  No SI or HI expressed.  Depression screen [facility] 2/9 08/06/2018 06/11/2018 05/06/2018 03/26/2018 12/26/2016  Decreased Interest 1 2 2 1 3   Down, Depressed, Hopeless 1 2 0 1 3  PHQ - 2 Score 2 4 2 2 6   Altered sleeping 3 3 3 2 3   Tired, decreased energy 3 2 2 1 3   Change in appetite 2 2 1 1 3   Feeling bad or failure about yourself  0 2 0 0 1  Trouble concentrating 0 0 0 0 3  Moving slowly or fidgety/restless 0 1 0 0 2  Suicidal thoughts 0 0 0 0 0  PHQ-9 Score 10 14 8 6 21   Difficult doing work/chores Extremely dIfficult Somewhat difficult Somewhat difficult Somewhat difficult -   GAD 7 : Generalized Anxiety Score 08/06/2018 06/11/2018 05/06/2018 03/26/2018  Nervous, Anxious, on Edge 2 1 2 1     Control/stop worrying 3 1 2 1   Worry too much - different things 0 1 2 1   Trouble relaxing 2 1 2 3   Restless 0 2 0 1  Easily annoyed or irritable 1 1 2 1   Afraid - awful might happen 2 2 0 3  Total GAD 7 Score 10 9 10 11   Anxiety Difficulty Extremely difficult Somewhat difficult Somewhat difficult Extremely difficult     Lab and Radiology Results No results found for this or any previous visit (from the past 72 hour(s)). No results found.    Assessment and Plan: 37 y.o. female with depression and anxiety stable to slightly worsened due to recent job stress.  Plan to increase fluoxetine to 40 mg daily.  Continue amitriptyline.  Recheck in about a month or so.  Return sooner if needed..  Additionally patient is still smoking she is not ready to quit.  She does agree to cut back.  No orders of the defined types were placed in this encounter.  Meds ordered this encounter  Medications  . amitriptyline (ELAVIL) 75 MG tablet    Sig: Take 1 tablet (75 mg total) by mouth at bedtime as needed for sleep.    Dispense:  90 tablet    Refill:  2  .  FLUoxetine (PROZAC) 40 MG capsule    Sig: Take 1 capsule (40 mg total) by mouth daily.    Dispense:  90 capsule    Refill:  1    Replaces 20mg  dose     Historical information moved to improve visibility of documentation.  Past Medical History:  Diagnosis Date  . Colitis    No past surgical history on file. Social History   Tobacco Use  . Smoking status: Current Every Day Smoker    Packs/day: 0.25    Types: Cigarettes  . Smokeless tobacco: Never Used  Substance Use Topics  . Alcohol use: No    Alcohol/week: 0.0 standard drinks   family history is not on file.  Medications: Current Outpatient Medications  Medication Sig Dispense Refill  . amitriptyline (ELAVIL) 75 MG tablet Take 1 tablet (75 mg total) by mouth at bedtime as needed for sleep. 90 tablet 2  . FLUoxetine (PROZAC) 40 MG capsule Take 1 capsule (40 mg total) by mouth  daily. 90 capsule 1  . omeprazole (PRILOSEC) 40 MG capsule Take 1 capsule (40 mg total) by mouth daily. 90 capsule 1   No current facility-administered medications for this visit.    Allergies  Allergen Reactions  . Morphine And Related Nausea And Vomiting  . Carafate [Sucralfate] Rash    Rash     Discussed warning signs or symptoms. Please see discharge instructions. Patient expresses understanding.

## 2018-08-06 NOTE — Patient Instructions (Signed)
Thank you for coming in today. Increase prozac to 40mg  daily.  Recheck with me in 1 month.  Reduce smoking if you can.  I am happy to talk with your attorney if needed.    Living With Anxiety After being diagnosed with an anxiety disorder, you may be relieved to know why you have felt or behaved a certain way. It is natural to also feel overwhelmed about the treatment ahead and what it will mean for your life. With care and support, you can manage this condition and recover from it. How to cope with anxiety Dealing with stress Stress is your body's reaction to life changes and events, both good and bad. Stress can last just a few hours or it can be ongoing. Stress can play a major role in anxiety, so it is important to learn both how to cope with stress and how to think about it differently. Talk with your health care provider or a counselor to learn more about stress reduction. He or she may suggest some stress reduction techniques, such as:  Music therapy. This can include creating or listening to music that you enjoy and that inspires you.  Mindfulness-based meditation. This involves being aware of your normal breaths, rather than trying to control your breathing. It can be done while sitting or walking.  Centering prayer. This is a kind of meditation that involves focusing on a word, phrase, or sacred image that is meaningful to you and that brings you peace.  Deep breathing. To do this, expand your stomach and inhale slowly through your nose. Hold your breath for 3-5 seconds. Then exhale slowly, allowing your stomach muscles to relax.  Self-talk. This is a skill where you identify thought patterns that lead to anxiety reactions and correct those thoughts.  Muscle relaxation. This involves tensing muscles then relaxing them.  Choose a stress reduction technique that fits your lifestyle and personality. Stress reduction techniques take time and practice. Set aside 5-15 minutes a day to  do them. Therapists can offer training in these techniques. The training may be covered by some insurance plans. Other things you can do to manage stress include:  Keeping a stress diary. This can help you learn what triggers your stress and ways to control your response.  Thinking about how you respond to certain situations. You may not be able to control everything, but you can control your reaction.  Making time for activities that help you relax, and not feeling guilty about spending your time in this way.  Therapy combined with coping and stress-reduction skills provides the best chance for successful treatment. Medicines Medicines can help ease symptoms. Medicines for anxiety include:  Anti-anxiety drugs.  Antidepressants.  Beta-blockers.  Medicines may be used as the main treatment for anxiety disorder, along with therapy, or if other treatments are not working. Medicines should be prescribed by a health care provider. Relationships Relationships can play a big part in helping you recover. Try to spend more time connecting with trusted friends and family members. Consider going to couples counseling, taking family education classes, or going to family therapy. Therapy can help you and others better understand the condition. How to recognize changes in your condition Everyone has a different response to treatment for anxiety. Recovery from anxiety happens when symptoms decrease and stop interfering with your daily activities at home or work. This may mean that you will start to:  Have better concentration and focus.  Sleep better.  Be less irritable.  Have more  energy.  Have improved memory.  It is important to recognize when your condition is getting worse. Contact your health care provider if your symptoms interfere with home or work and you do not feel like your condition is improving. Where to find help and support: You can get help and support from these  sources:  Self-help groups.  Online and [employer].  A trusted spiritual leader.  Couples counseling.  Family education classes.  Family therapy.  Follow these instructions at home:  Eat a healthy diet that includes plenty of vegetables, fruits, whole grains, low-fat dairy products, and lean protein. Do not eat a lot of foods that are high in solid fats, added sugars, or salt.  Exercise. Most adults should do the following: ? Exercise for at least 150 minutes each week. The exercise should increase your heart rate and make you sweat (moderate-intensity exercise). ? Strengthening exercises at least twice a week.  Cut down on caffeine, tobacco, alcohol, and other potentially harmful substances.  Get the right amount and quality of sleep. Most adults need 7-9 hours of sleep each night.  Make choices that simplify your life.  Take over-the-counter and prescription medicines only as told by your health care provider.  Avoid caffeine, alcohol, and certain over-the-counter cold medicines. These may make you feel worse. Ask your pharmacist which medicines to avoid.  Keep all follow-up visits as told by your health care provider. This is important. Questions to ask your health care provider  Would I benefit from therapy?  How often should I follow up with a health care provider?  How long do I need to take medicine?  Are there any long-term side effects of my medicine?  Are there any alternatives to taking medicine? Contact a health care provider if:  You have a hard time staying focused or finishing daily tasks.  You spend many hours a day feeling worried about everyday life.  You become exhausted by worry.  You start to have headaches, feel tense, or have nausea.  You urinate more than normal.  You have diarrhea. Get help right away if:  You have a racing heart and shortness of breath.  You have thoughts of hurting yourself or others. If you ever  feel like you may hurt yourself or others, or have thoughts about taking your own life, get help right away. You can go to your nearest emergency department or call:  Your local emergency services (911 in the [place]  A suicide crisis helpline, such as the National Suicide Prevention Lifeline at (580)204-5529. This is open 24-hours a day.  Summary  Taking steps to deal with stress can help calm you.  Medicines cannot cure anxiety disorders, but they can help ease symptoms.  Family, friends, and partners can play a big part in helping you recover from an anxiety disorder. This information is not intended to replace advice given to you by your health care provider. Make sure you discuss any questions you have with your health care provider. Document Released: 08/28/2016 Document Revised: 08/28/2016 Document Reviewed: 08/28/2016 Elsevier Interactive Patient Education  [employer].

## 2018-08-25 ENCOUNTER — Telehealth: Payer: Self-pay | Admitting: Family Medicine

## 2018-08-25 NOTE — Telephone Encounter (Signed)
[facility]  Patient scheduled for colonoscopy September 23, 2018

## 2018-09-04 ENCOUNTER — Ambulatory Visit: Payer: POS | Admitting: Family Medicine

## 2018-09-22 ENCOUNTER — Ambulatory Visit (INDEPENDENT_AMBULATORY_CARE_PROVIDER_SITE_OTHER): Payer: POS | Admitting: Family Medicine

## 2018-09-22 ENCOUNTER — Encounter: Payer: Self-pay | Admitting: Family Medicine

## 2018-09-22 VITALS — BP 149/86 | HR 102 | Temp 98.2°F | Wt 213.0 lb

## 2018-09-22 DIAGNOSIS — J40 Bronchitis, not specified as acute or chronic: Secondary | ICD-10-CM | POA: Diagnosis not present

## 2018-09-22 DIAGNOSIS — F321 Major depressive disorder, single episode, moderate: Secondary | ICD-10-CM

## 2018-09-22 DIAGNOSIS — F411 Generalized anxiety disorder: Secondary | ICD-10-CM

## 2018-09-22 MED ORDER — AMITRIPTYLINE HCL 75 MG PO TABS: 75.0000 mg | ORAL_TABLET | Freq: Every evening | ORAL | 2 refills | Status: DC | PRN

## 2018-09-22 MED ORDER — BENZONATATE 200 MG PO CAPS: 200.0000 mg | ORAL_CAPSULE | Freq: Three times a day (TID) | ORAL | 0 refills | Status: DC | PRN

## 2018-09-22 MED ORDER — ALBUTEROL SULFATE 108 (90 BASE) MCG/ACT IN AEPB: 1.0000 | INHALATION_SPRAY | Freq: Four times a day (QID) | RESPIRATORY_TRACT | 1 refills | Status: DC | PRN

## 2018-09-22 MED ORDER — PREDNISONE 10 MG PO TABS: 30.0000 mg | ORAL_TABLET | Freq: Every day | ORAL | 0 refills | Status: DC

## 2018-09-22 NOTE — Patient Instructions (Addendum)
Thank you for coming in today. For the bronchitis.  Take prednisone and use the inhaler and cough pill as needed.  Continue current dose of prozac.   Recheck with me in 3 months or sooner if not doing well.    Acute Bronchitis, Adult Acute bronchitis is when air tubes (bronchi) in the lungs suddenly get swollen. The condition can make it hard to breathe. It can also cause these symptoms:  A cough.  Coughing up clear, yellow, or green mucus.  Wheezing.  Chest congestion.  Shortness of breath.  A fever.  Body aches.  Chills.  A sore throat. Follow these instructions at home:  Medicines  Take over-the-counter and prescription medicines only as told by your doctor.  If you were prescribed an antibiotic medicine, take it as told by your doctor. Do not stop taking the antibiotic even if you start to feel better. General instructions  Rest.  Drink enough fluids to keep your pee (urine) pale yellow.  Avoid smoking and secondhand smoke. If you smoke and you need help quitting, ask your doctor. Quitting will help your lungs heal faster.  Use an inhaler, cool mist vaporizer, or humidifier as told by your doctor.  Keep all follow-up visits as told by your doctor. This is important. How is this prevented? To lower your risk of getting this condition again:  Wash your hands often with soap and water. If you cannot use soap and water, use hand sanitizer.  Avoid contact with people who have cold symptoms.  Try not to touch your hands to your mouth, nose, or eyes.  Make sure to get the flu shot every year. Contact a doctor if:  Your symptoms do not get better in 2 weeks. Get help right away if:  You cough up blood.  You have chest pain.  You have very bad shortness of breath.  You become dehydrated.  You faint (pass out) or keep feeling like you are going to pass out.  You keep throwing up (vomiting).  You have a very bad headache.  Your fever or chills gets  worse. This information is not intended to replace advice given to you by your health care provider. Make sure you discuss any questions you have with your health care provider. Document Released: 02/20/2008 Document Revised: 04/17/2017 Document Reviewed: 02/22/2016 Elsevier Interactive Patient Education  2019 [employer].

## 2018-09-22 NOTE — Progress Notes (Signed)
Cathy Lester is a 38 y.o. female who [facility] [place] today for follow-up mood, and discuss 1 week history of upper respiratory illness.  Cathy Lester notes a one-week history of runny nose sneezing cough congestion body aches.  She is tried some over-the-counter medications which have helped a bit.  She denies any vomiting diarrhea chest pain or palpitations.  She does note a little bit of wheezing.  She does smoke but has never been diagnosed with COPD or asthma.  She has used an inhaler in the remote past but not with his current illness.   Additionally she notes slight improvement in her anxiety and depressive symptoms.  She has a history of anxiety and depression largely related to her job.  Her supervisor which was very stressful was gone for a while but has since returned.  Additionally her Prozac was increased from 20 mg daily to 40 mg daily.  She notes improvement but not full resolution of symptoms.  ROS as above:  Exam:  BP (!) 149/86   Pulse (!) 102   Temp 98.2 F (36.8 C) (Oral)   Wt 213 lb (96.6 kg)   BMI 33.36 kg/m  Wt Readings from Last 5 Encounters:  09/22/18 213 lb (96.6 kg)  08/06/18 214 lb (97.1 kg)  07/02/18 210 lb (95.3 kg)  06/22/18 180 lb (81.6 kg)  06/11/18 206 lb (93.4 kg)    Gen: Well NAD HEENT: EOMI,  MMM clear nasal discharge.  Tender palpation bilateral maxillary sinuses.  Mild right-sided cervical lymphadenopathy.  Normal posterior pharynx.  Normal tympanic membranes bilaterally. Lungs: Normal work of breathing. CTABL Heart: RRR no MRG Abd: NABS, Soft. Nondistended, Nontender Exts: Brisk capillary refill, warm and well perfused.  Psych alert and oriented normal speech thought process and affect.  No SI or HI expressed.  [facility] 2/9 09/22/2018 08/06/2018 06/11/2018 05/06/2018 03/26/2018  Decreased Interest 1 1 2 2 1     Down, Depressed, Hopeless 0 1 2 0 1  PHQ - 2 Score 1 2 4 2 2   Altered sleeping 3 3 3 3 2   Tired, decreased energy 3 3 2 2 1   Change in appetite 1 2 2 1 1   Feeling bad or failure about yourself  0 0 2 0 0  Trouble concentrating 0 0 0 0 0  Moving slowly or fidgety/restless 0 0 1 0 0  Suicidal thoughts 0 0 0 0 0  PHQ-9 Score 8 10 14 8 6   Difficult doing work/chores Somewhat difficult Extremely dIfficult Somewhat difficult Somewhat difficult Somewhat difficult    GAD 7 : Generalized Anxiety Score 09/22/2018 08/06/2018 06/11/2018 05/06/2018  Nervous, Anxious, on Edge 1 2 1 2   Control/stop worrying 1 3 1 2   Worry too much - different things 1 0 1 2  Trouble relaxing 0 2 1 2   Restless 0 0 2 0  Easily annoyed or irritable 0 1 1 2   Afraid - awful might happen 1 2 2  0  Total GAD 7 Score 4 10 9 10   Anxiety Difficulty Somewhat difficult Extremely difficult Somewhat difficult Somewhat difficult      Lab and Radiology Results No results found for this or any previous visit (from the past 72 hour(s)). No results found.    Assessment and Plan: 38 y.o. female with  Viral URI likely bronchitis.  Treat with prednisone Tessalon Perles and use additionally albuterol.  If not improving next that may be  oral antibiotics however likely will improve with the above treatment.  Recheck as needed in the near future if not improving.  Depression and anxiety: Improved with Prozac 40 mg daily.  Plan to continue current regimen and recheck in about 3 months.  Can increase to 60 mg daily if needed.  PDMP not reviewed this encounter. No orders of the defined types were placed in this encounter.  Meds ordered this encounter  Medications  . predniSONE (DELTASONE) 10 MG tablet    Sig: Take 3 tablets (30 mg total) by mouth daily with breakfast.    Dispense:  15 tablet    Refill:  0  . Albuterol Sulfate (PROAIR RESPICLICK) 108 (90 Base) MCG/ACT AEPB    Sig: Inhale 1 puff into the lungs every 6 (six) hours as  needed (shortness of breath or wheezing).    Dispense:  1 each    Refill:  1  . benzonatate (TESSALON) 200 MG capsule    Sig: Take 1 capsule (200 mg total) by mouth 3 (three) times daily as needed for cough.    Dispense:  45 capsule    Refill:  0  . amitriptyline (ELAVIL) 75 MG tablet    Sig: Take 1-2 tablets (75-150 mg total) by mouth at bedtime as needed for sleep.    Dispense:  180 tablet    Refill:  2     Historical information moved to improve visibility of documentation.  Past Medical History:  Diagnosis Date  . Colitis    No past surgical history on file. Social History   Tobacco Use  . Smoking status: Current Every Day Smoker    Packs/day: 0.50    Types: Cigarettes  . Smokeless tobacco: Never Used  Substance Use Topics  . Alcohol use: No    Alcohol/week: 0.0 standard drinks   family history is not on file.  Medications: Current Outpatient Medications  Medication Sig Dispense Refill  . amitriptyline (ELAVIL) 75 MG tablet Take 1-2 tablets (75-150 mg total) by mouth at bedtime as needed for sleep. 180 tablet 2  . FLUoxetine (PROZAC) 40 MG capsule Take 1 capsule (40 mg total) by mouth daily. 90 capsule 1  . omeprazole (PRILOSEC) 40 MG capsule Take 1 capsule (40 mg total) by mouth daily. 90 capsule 1  . Albuterol Sulfate (PROAIR RESPICLICK) 108 (90 Base) MCG/ACT AEPB Inhale 1 puff into the lungs every 6 (six) hours as needed (shortness of breath or wheezing). 1 each 1  . benzonatate (TESSALON) 200 MG capsule Take 1 capsule (200 mg total) by mouth 3 (three) times daily as needed for cough. 45 capsule 0  . predniSONE (DELTASONE) 10 MG tablet Take 3 tablets (30 mg total) by mouth daily with breakfast. 15 tablet 0   No current facility-administered medications for this visit.    Allergies  Allergen Reactions  . Morphine And Related Nausea And Vomiting  . Carafate [Sucralfate] Rash    Rash     Discussed warning signs or symptoms. Please see discharge instructions.  Patient expresses understanding.

## 2019-01-02 ENCOUNTER — Other Ambulatory Visit: Payer: Self-pay | Admitting: Family Medicine

## 2019-03-09 ENCOUNTER — Ambulatory Visit: Payer: POS | Admitting: Family Medicine

## 2019-03-18 ENCOUNTER — Encounter: Payer: Self-pay | Admitting: Family Medicine

## 2019-03-18 ENCOUNTER — Telehealth: Payer: Self-pay | Admitting: *Deleted

## 2019-03-18 ENCOUNTER — Ambulatory Visit (INDEPENDENT_AMBULATORY_CARE_PROVIDER_SITE_OTHER): Payer: POS | Admitting: Family Medicine

## 2019-03-18 VITALS — Ht 67.0 in

## 2019-03-18 DIAGNOSIS — R51 Headache: Secondary | ICD-10-CM

## 2019-03-18 DIAGNOSIS — R519 Headache, unspecified: Secondary | ICD-10-CM

## 2019-03-18 DIAGNOSIS — R109 Unspecified abdominal pain: Secondary | ICD-10-CM

## 2019-03-18 DIAGNOSIS — G4700 Insomnia, unspecified: Secondary | ICD-10-CM

## 2019-03-18 DIAGNOSIS — Z20822 Contact with and (suspected) exposure to covid-19: Secondary | ICD-10-CM

## 2019-03-18 DIAGNOSIS — Z8659 Personal history of other mental and behavioral disorders: Secondary | ICD-10-CM | POA: Diagnosis not present

## 2019-03-18 DIAGNOSIS — R6889 Other general symptoms and signs: Secondary | ICD-10-CM

## 2019-03-18 MED ORDER — DICYCLOMINE HCL 10 MG PO CAPS: 10.0000 mg | ORAL_CAPSULE | Freq: Three times a day (TID) | ORAL | 1 refills | Status: DC | PRN

## 2019-03-18 MED ORDER — SUMATRIPTAN SUCCINATE 50 MG PO TABS: 50.0000 mg | ORAL_TABLET | ORAL | 0 refills | Status: DC | PRN

## 2019-03-18 MED ORDER — AMITRIPTYLINE HCL 75 MG PO TABS: 75.0000 mg | ORAL_TABLET | Freq: Every evening | ORAL | 2 refills | Status: DC | PRN

## 2019-03-18 NOTE — Telephone Encounter (Signed)
-----   Message from Gregor Hams, MD sent at 03/18/2019  1:56 PM EDT ----- Regarding: Please arrange for COVID-19 testing Please arrange for COVID-19 testing. Headache and diarrrhea.

## 2019-03-18 NOTE — Telephone Encounter (Signed)
Lm for pt to call back @ [phone] M-F 7a-7p to schedule covid testing. Order placed

## 2019-03-18 NOTE — Patient Instructions (Signed)
Thank you for coming in today.    

## 2019-03-18 NOTE — Progress Notes (Signed)
Virtual Visit  via Video Note  I connected with      Cathy Lester by a video enabled telemedicine application and verified that I am speaking with the correct person using two identifiers.   I discussed the limitations of evaluation and management by telemedicine and the availability of in person appointments. The patient expressed understanding and agreed to proceed.  History of Present Illness: Cathy Lester is a 38 y.o. female who would like to discuss headache and stomach cramping.  Patient notes a 1 week history of mild headache and stomach cramping.  She notes a few episodes of diarrhea.  Additionally 2 days ago she started having a menstrual cycle and notes that her menstrual cramping is a little bit worse than usual.  She typically has regular periods.  She is tried some ibuprofen which helps.  She notes the headache is constant throbbing and can be severe at times.  She notes her headache is consistent with prior episodes of migraines.  She denies any visual changes weakness or numbness or loss of function.  Additionally she notes that she needs a refill of amitriptyline for insomnia which typically helps pretty well.  Also she has been out of her Prozac for about 2 months.  She was able to wean off of it and notes that her mood is pretty well controlled.   Notes multiple COVID-19 cases at work.  Observations/Objective: Ht 5\' 7"  (1.702 m)   BMI 33.36 kg/m  Wt Readings from Last 5 Encounters:  09/22/18 213 lb (96.6 kg)  08/06/18 214 lb (97.1 kg)  07/02/18 210 lb (95.3 kg)  06/22/18 180 lb (81.6 kg)  06/11/18 206 lb (93.4 kg)   Exam: Appearance nontoxic no acute distress Normal Speech.  Neuro: Alert and oriented normal coordination moves all extremities.  Facial motion is symmetric and equal bilaterally.    Assessment and Plan: 38 y.o. female with headache abdominal cramping with mild fatigue.  Recent exposure to COVID-19.  Obviously concerning for COVID-19.  Plan for  outpatient testing.  Will treat cramping with dicyclomine as well as over-the-counter medication.  Headache consistent with prior migraines.  Telemedicine neurological exam appears to be benign.  Plan for trial of sumatriptan.  Mood: Doing well without Prozac.  Watchful waiting.  PDMP not reviewed this encounter. No orders of the defined types were placed in this encounter.  Meds ordered this encounter  Medications  . amitriptyline (ELAVIL) 75 MG tablet    Sig: Take 1-2 tablets (75-150 mg total) by mouth at bedtime as needed for sleep.    Dispense:  180 tablet    Refill:  2  . dicyclomine (BENTYL) 10 MG capsule    Sig: Take 1-2 capsules (10-20 mg total) by mouth 3 (three) times daily as needed for spasms (abdominal cramps).    Dispense:  90 capsule    Refill:  1  . SUMAtriptan (IMITREX) 50 MG tablet    Sig: Take 1 tablet (50 mg total) by mouth every 2 (two) hours as needed for migraine. May repeat in 2 hours if headache persists or recurs.    Dispense:  30 tablet    Refill:  0    Follow Up Instructions:    I discussed the assessment and treatment plan with the patient. The patient was provided an opportunity to ask questions and all were answered. The patient agreed with the plan and demonstrated an understanding of the instructions.   The patient was advised to call back or seek  an in-person evaluation if the symptoms worsen or if the condition fails to improve as anticipated.  Time: 25 minutes of intraservice time, with >39 minutes of total time during today's visit.      Historical information moved to improve visibility of documentation.  Past Medical History:  Diagnosis Date  . Colitis    No past surgical history on file. Social History   Tobacco Use  . Smoking status: Current Every Day Smoker    Packs/day: 0.50    Types: Cigarettes  . Smokeless tobacco: Never Used  Substance Use Topics  . Alcohol use: No    Alcohol/week: 0.0 standard drinks   family history  is not on file.  Medications: Current Outpatient Medications  Medication Sig Dispense Refill  . Albuterol Sulfate (PROAIR RESPICLICK) 108 (90 Base) MCG/ACT AEPB Inhale 1 puff into the lungs every 6 (six) hours as needed (shortness of breath or wheezing). 1 each 1  . amitriptyline (ELAVIL) 75 MG tablet Take 1-2 tablets (75-150 mg total) by mouth at bedtime as needed for sleep. 180 tablet 2  . omeprazole (PRILOSEC) 40 MG capsule Take 1 capsule (40 mg total) by mouth daily. 90 capsule 1  . dicyclomine (BENTYL) 10 MG capsule Take 1-2 capsules (10-20 mg total) by mouth 3 (three) times daily as needed for spasms (abdominal cramps). 90 capsule 1  . SUMAtriptan (IMITREX) 50 MG tablet Take 1 tablet (50 mg total) by mouth every 2 (two) hours as needed for migraine. May repeat in 2 hours if headache persists or recurs. 30 tablet 0   No current facility-administered medications for this visit.    Allergies  Allergen Reactions  . Carafate [Sucralfate] Rash    Rash  . Morphine And Related Nausea And Vomiting

## 2019-03-19 ENCOUNTER — Other Ambulatory Visit: Payer: POS

## 2019-03-19 DIAGNOSIS — Z20822 Contact with and (suspected) exposure to covid-19: Secondary | ICD-10-CM

## 2019-03-19 NOTE — Telephone Encounter (Signed)
Spoke with patient - scheduled her for COVID 19 test today at 2 pm at [facility].  Testing protocol reviewed.

## 2019-03-19 NOTE — Telephone Encounter (Signed)
Patient called to schedule covid test. Call back # [phone]

## 2019-03-25 LAB — NOVEL CORONAVIRUS, NAA: SARS-CoV-2, NAA: NOT DETECTED

## 2019-10-13 ENCOUNTER — Encounter: Payer: Self-pay | Admitting: Nurse Practitioner

## 2019-10-13 ENCOUNTER — Ambulatory Visit (INDEPENDENT_AMBULATORY_CARE_PROVIDER_SITE_OTHER): Payer: POS | Admitting: Nurse Practitioner

## 2019-10-13 ENCOUNTER — Other Ambulatory Visit: Payer: Self-pay

## 2019-10-13 VITALS — BP 131/85 | HR 91 | Temp 98.0°F | Ht 67.0 in | Wt 210.1 lb

## 2019-10-13 DIAGNOSIS — F411 Generalized anxiety disorder: Secondary | ICD-10-CM | POA: Diagnosis not present

## 2019-10-13 DIAGNOSIS — Z209 Contact with and (suspected) exposure to unspecified communicable disease: Secondary | ICD-10-CM

## 2019-10-13 DIAGNOSIS — K529 Noninfective gastroenteritis and colitis, unspecified: Secondary | ICD-10-CM | POA: Diagnosis not present

## 2019-10-13 NOTE — Progress Notes (Signed)
Acute Office Visit  Subjective:    Patient ID: Cathy Lester, female    DOB: Apr 24, 1981, 39 y.o.   MRN: [id number]  CC: FMLA paperwork completion  HPI  FMLA/COLITIS Patient is in today for completion of FMLA paperwork for diagnosis of colitis.  She reports symptoms with colitis including abdominal pain, nausea, diarrhea, chills, and headache that occur approximately every 1 to 3 months and can last 1 to 5 days.  During these periods she is unable to perform her daily duties at work. Paperwork today is for renewal. Patient reports she is currently having symptoms of abdominal pain, abdominal tenderness, diarrhea, and bloating.  She reports she has been taking her dicyclomine daily which helps some with the cramping.  She states the symptoms are worse with increased anxiety, which she presently has.  She is requesting a note for for work absence.  ANXIETY Patient reports that she has had an increased amount of anxiety lately, most specifically pertaining to work and family events.  She reports her aunt is currently in the hospital on mechanical ventilation due to COVID-19.  She states that her aunt is not expected to survive and the family is planning to remove her from life support tomorrow.  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  She reports they have been required to work increasingly long hours due to short staffing and increased postal traffic.  She reports she recently has been told she will be required to work 7 days in a row.  She reports the stress from work is affecting her health and feels that it is causing an exacerbation of her colitis.   COVID-19 exposures The patient also reports that she has had multiple exposures to COVID-19 at work.  She reports that several of her coworkers have been taken out recently after testing positive.  She is extremely concerned and this is causing a great deal of anxiety.  She reports often times workers are in close proximity and are not  wearing their masks properly.  She is requesting testing today.  She denies any symptoms of shortness of breath, cough, congestion, loss of taste, or loss of smell.  She does have GI symptoms, however, she is not sure if these are from colitis or possible infection.  She would like to be tested today.  GAD 7 : Generalized Anxiety Score 10/13/2019 03/18/2019 09/22/2018 08/06/2018  Nervous, Anxious, on Edge 1 0 1 2  Control/stop worrying 1 0 1 3  Worry too much - different things 1 0 1 0  Trouble relaxing 1 0 0 2  Restless 0 0 0 0  Easily annoyed or irritable 1 0 0 1  Afraid - awful might happen 1 0 1 2  Total GAD 7 Score 6 0 4 10  Anxiety Difficulty Somewhat difficult Not difficult at all Somewhat difficult Extremely difficult   PHQ9 SCORE ONLY 10/13/2019 03/18/2019 09/22/2018  Score 8 2 8     Past Medical History:  Diagnosis Date  . Colitis     No past surgical history on file.  No family history on file.  Social History   Socioeconomic History  . Marital status: Single    Spouse name: Not on file  . Number of children: Not on file  . Years of education: Not on file  . Highest education level: Not on file  Occupational History  . Not on file  Tobacco Use  . Smoking status: Current Every Day Smoker    Packs/day:  0.50    Types: Cigarettes  . Smokeless tobacco: Never Used  Substance and Sexual Activity  . Alcohol use: No    Alcohol/week: 0.0 standard drinks  . Drug use: No  . Sexual activity: Yes    Partners: Female  Other Topics Concern  . Not on file  Social History Narrative  . Not on file   Social Determinants of Health   Financial Resource Strain:   . Difficulty of Paying Living Expenses: Not on file  Food Insecurity:   . Worried About Programme researcher, broadcasting/film/video in the Last Year: Not on file  . Ran Out of Food in the Last Year: Not on file  Transportation Needs:   . Lack of Transportation (Medical): Not on file  . Lack of Transportation (Non-Medical): Not on file   Physical Activity:   . Days of Exercise per Week: Not on file  . Minutes of Exercise per Session: Not on file  Stress:   . Feeling of Stress : Not on file  Social Connections:   . Frequency of Communication with Friends and Family: Not on file  . Frequency of Social Gatherings with Friends and Family: Not on file  . Attends Religious Services: Not on file  . Active Member of Clubs or Organizations: Not on file  . Attends Banker Meetings: Not on file  . Marital Status: Not on file  Intimate Partner Violence:   . Fear of Current or Ex-Partner: Not on file  . Emotionally Abused: Not on file  . Physically Abused: Not on file  . Sexually Abused: Not on file    Outpatient Medications Prior to Visit  Medication Sig Dispense Refill  . Albuterol Sulfate (PROAIR RESPICLICK) 108 (90 Base) MCG/ACT AEPB Inhale 1 puff into the lungs every 6 (six) hours as needed (shortness of breath or wheezing). 1 each 1  . amitriptyline (ELAVIL) 75 MG tablet Take 1-2 tablets (75-150 mg total) by mouth at bedtime as needed for sleep. 180 tablet 2  . dicyclomine (BENTYL) 10 MG capsule Take 1-2 capsules (10-20 mg total) by mouth 3 (three) times daily as needed for spasms (abdominal cramps). 90 capsule 1  . omeprazole (PRILOSEC) 40 MG capsule Take 1 capsule (40 mg total) by mouth daily. 90 capsule 1  . SUMAtriptan (IMITREX) 50 MG tablet Take 1 tablet (50 mg total) by mouth every 2 (two) hours as needed for migraine. May repeat in 2 hours if headache persists or recurs. 30 tablet 0   No facility-administered medications prior to visit.    Allergies  Allergen Reactions  . Carafate [Sucralfate] Rash    Rash  . Morphine And Related Nausea And Vomiting    Review of Systems  Constitutional: Positive for fatigue. Negative for chills and fever.  HENT: Negative for postnasal drip, rhinorrhea, sinus pressure, sinus pain, sneezing and sore throat.   Respiratory: Negative for cough, chest tightness and  shortness of breath.   Cardiovascular: Negative for chest pain and palpitations.  Gastrointestinal: Positive for abdominal distention, abdominal pain, diarrhea and nausea.  Musculoskeletal: Negative for myalgias.  Neurological: Negative for weakness, light-headedness and headaches.  Psychiatric/Behavioral: The patient is nervous/anxious.        Objective:    Physical Exam Constitutional:      Appearance: Normal appearance.  HENT:     Head: Normocephalic.     Nose: Nose normal.     Mouth/Throat:     Mouth: Mucous membranes are moist.     Pharynx: Oropharynx is  clear.  Cardiovascular:     Rate and Rhythm: Normal rate and regular rhythm.     Pulses: Normal pulses.     Heart sounds: Normal heart sounds.  Pulmonary:     Effort: Pulmonary effort is normal.     Breath sounds: Normal breath sounds.  Abdominal:     General: Bowel sounds are normal. There is no distension.     Palpations: Abdomen is rigid. There is no mass.     Tenderness: There is generalized abdominal tenderness. There is no guarding or rebound.  Musculoskeletal:        General: Normal range of motion.     Right lower leg: No edema.     Left lower leg: No edema.  Skin:    General: Skin is warm and dry.     Capillary Refill: Capillary refill takes less than 2 seconds.  Neurological:     General: No focal deficit present.     Mental Status: She is alert and oriented to person, place, and time.  Psychiatric:        Mood and Affect: Mood normal.        Behavior: Behavior normal.     There were no vitals taken for this visit. Wt Readings from Last 3 Encounters:  09/22/18 213 lb (96.6 kg)  08/06/18 214 lb (97.1 kg)  07/02/18 210 lb (95.3 kg)    Health Maintenance Due  Topic Date Due  . INFLUENZA VACCINE  04/18/2019    There are no preventive care reminders to display for this patient.   Lab Results  Component Value Date   TSH 1.63 08/13/2016   Lab Results  Component Value Date   WBC 7.1  06/22/2018   HGB 12.0 06/22/2018   HCT 36.9 06/22/2018   MCV 86.0 06/22/2018   PLT 269 06/22/2018   Lab Results  Component Value Date   NA 141 06/22/2018   K 4.0 06/22/2018   CO2 25 06/22/2018   GLUCOSE 83 06/22/2018   BUN 8 06/22/2018   CREATININE 0.72 06/22/2018   BILITOT 0.6 06/22/2018   ALKPHOS 44 06/22/2018   AST 24 06/22/2018   ALT 22 06/22/2018   PROT 7.0 06/22/2018   ALBUMIN 3.7 06/22/2018   CALCIUM 9.0 06/22/2018   ANIONGAP 8 06/22/2018   No results found for: CHOL No results found for: HDL No results found for: LDLCALC No results found for: TRIG No results found for: CHOLHDL No results found for: KDXI3J     Assessment & Plan:   1. Contact with or exposure to communicable disease Patient has had multiple contacts with positive persons in the workplace.  She is not currently exhibiting classic symptoms, however, she is extremely concerned about the possibility of having a virus. We will test today for COVID-19 virus. Discussed with the patient signs and symptoms to monitor. We will follow up with patient with results. - Novel Coronavirus, NAA ([facility])  2. Colitis FMLA paperwork for colitis filled out and faxed to the number provided.  A copy of the paperwork was given to the patient and a copy will be scanned into the patient's chart. Symptoms and presentation most correlate with colitis flare. Encouraged patient to continue dicyclomine daily for symptoms and to rest as much as possible over the next few days. Work note provided for patient to remain out of work until Saturday.  Discussed with patient if symptoms of nausea increase to let me know and I can provide a prescription for Zofran. Follow-up if  symptoms worsen or fail to improve with either me or gastroenterology.  3. GAD (generalized anxiety disorder) Patient experiencing increased anxiety symptoms associated with dealing with the illness and projected death of her aunt and increased work  Counselling psychologist.  In addition there are significant perceived dangers in the work environment from exposure to COVID-19.  GAD-7 and PHQ-9 both completed today (results above).  Discussed the option of daily medication to help with anxiety and depression symptoms.  The patient has declined at this time.  I am open to discussing this with her further through virtual visit in the future if she so chooses. For now I recommend continuing the amitriptyline at night to help with sleep and hopefully this will help some with her mood. We will check on the patient at the end of the week to see how she was feeling.  Would like to follow-up with her in a month or so to see if her mood symptoms have stabilized.  Return in 2 months (on 12/11/2019), or if symptoms worsen or fail to improve, for anxiety.   Tollie Eth, NP

## 2019-10-13 NOTE — Patient Instructions (Signed)
Generalized Anxiety Disorder, Adult Generalized anxiety disorder (GAD) is a mental health disorder. People with this condition constantly worry about everyday events. Unlike normal anxiety, worry related to GAD is not triggered by a specific event. These worries also do not fade or get better with time. GAD interferes with life functions, including relationships, work, and school. GAD can vary from mild to severe. People with severe GAD can have intense waves of anxiety with physical symptoms (panic attacks). What are the causes? The exact cause of GAD is not known. What increases the risk? This condition is more likely to develop in:  Women.  People who have a family history of anxiety disorders.  People who are very shy.  People who experience very stressful life events, such as the death of a loved one.  People who have a very stressful family environment. What are the signs or symptoms? People with GAD often worry excessively about many things in their lives, such as their health and family. They may also be overly concerned about:  Doing well at work.  Being on time.  Natural disasters.  Friendships. Physical symptoms of GAD include:  Fatigue.  Muscle tension or having muscle twitches.  Trembling or feeling shaky.  Being easily startled.  Feeling like your heart is pounding or racing.  Feeling out of breath or like you cannot take a deep breath.  Having trouble falling asleep or staying asleep.  Sweating.  Nausea, diarrhea, or irritable bowel syndrome (IBS).  Headaches.  Trouble concentrating or remembering facts.  Restlessness.  Irritability. How is this diagnosed? Your health care provider can diagnose GAD based on your symptoms and medical history. You will also have a physical exam. The health care provider will ask specific questions about your symptoms, including how severe they are, when they started, and if they come and go. Your health care  provider may ask you about your use of alcohol or drugs, including prescription medicines. Your health care provider may refer you to a mental health specialist for further evaluation. Your health care provider will do a thorough examination and may perform additional tests to rule out other possible causes of your symptoms. To be diagnosed with GAD, a person must have anxiety that:  Is out of his or her control.  Affects several different aspects of his or her life, such as work and relationships.  Causes distress that makes him or her unable to take part in normal activities.  Includes at least three physical symptoms of GAD, such as restlessness, fatigue, trouble concentrating, irritability, muscle tension, or sleep problems. Before your health care provider can confirm a diagnosis of GAD, these symptoms must be present more days than they are not, and they must last for six months or longer. How is this treated? The following therapies are usually used to treat GAD:  Medicine. Antidepressant medicine is usually prescribed for long-term daily control. Antianxiety medicines may be added in severe cases, especially when panic attacks occur.  Talk therapy (psychotherapy). Certain types of talk therapy can be helpful in treating GAD by providing support, education, and guidance. Options include: ? Cognitive behavioral therapy (CBT). People learn coping skills and techniques to ease their anxiety. They learn to identify unrealistic or negative thoughts and behaviors and to replace them with positive ones. ? Acceptance and commitment therapy (ACT). This treatment teaches people how to be mindful as a way to cope with unwanted thoughts and feelings. ? Biofeedback. This process trains you to manage your body's response (  physiological response) through breathing techniques and relaxation methods. You will work with a therapist while machines are used to monitor your physical symptoms.  Stress  management techniques. These include yoga, meditation, and exercise. A mental health specialist can help determine which treatment is best for you. Some people see improvement with one type of therapy. However, other people require a combination of therapies. Follow these instructions at home:  Take over-the-counter and prescription medicines only as told by your health care provider.  Try to maintain a normal routine.  Try to anticipate stressful situations and allow extra time to manage them.  Practice any stress management or self-calming techniques as taught by your health care provider.  Do not punish yourself for setbacks or for not making progress.  Try to recognize your accomplishments, even if they are small.  Keep all follow-up visits as told by your health care provider. This is important. Contact a health care provider if:  Your symptoms do not get better.  Your symptoms get worse.  You have signs of depression, such as: ? A persistently sad, cranky, or irritable mood. ? Loss of enjoyment in activities that used to bring you joy. ? Change in weight or eating. ? Changes in sleeping habits. ? Avoiding friends or family members. ? Loss of energy for normal tasks. ? Feelings of guilt or worthlessness. Get help right away if:  You have serious thoughts about hurting yourself or others. If you ever feel like you may hurt yourself or others, or have thoughts about taking your own life, get help right away. You can go to your nearest emergency department or call:  Your local emergency services (911 in the [place]  A suicide crisis helpline, such as the National Suicide Prevention Lifeline at 223-465-8220. This is open 24 hours a day. Summary  Generalized anxiety disorder (GAD) is a mental health disorder that involves worry that is not triggered by a specific event.  People with GAD often worry excessively about many things in their lives, such as their health and  family.  GAD may cause physical symptoms such as restlessness, trouble concentrating, sleep problems, frequent sweating, nausea, diarrhea, headaches, and trembling or muscle twitching.  A mental health specialist can help determine which treatment is best for you. Some people see improvement with one type of therapy. However, other people require a combination of therapies. This information is not intended to replace advice given to you by your health care provider. Make sure you discuss any questions you have with your health care provider. Document Revised: 08/16/2017 Document Reviewed: 07/24/2016 Elsevier Patient Education  2020 [employer]   Complicated Grief Grief is a normal response to the death of someone close to you. Feelings of fear, anger, and guilt can affect almost everyone who loses a loved one. It is also common to have symptoms of depression while you are grieving. These include problems with sleep, loss of appetite, and lack of energy. They may last for weeks or months after a loss. Complicated grief is different from normal grief or depression. Normal grieving involves sadness and feelings of loss, but those feelings get better and heal over time. Complicated grief is a severe type of grief that lasts for a long time, usually for several months to a year or longer. It interferes with your ability to function normally. Complicated grief may require treatment from a mental health care provider. What are the causes? The cause of this condition is not known. It is not clear why  some people continue to struggle with grief and others do not. What increases the risk? You are more likely to develop this condition if:  The death of your loved one was sudden or unexpected.  The death of your loved one was due to a violent event.  Your loved one died from suicide.  Your loved one was a child or a young person.  You were very close to your loved one, or you were dependent on him  or her.  You have a history of depression or anxiety. What are the signs or symptoms? Symptoms of this condition include:  Feeling disbelief or having a lack of emotion (numbness).  Being unable to enjoy good memories of your loved one.  Needing to avoid anything or anyone that reminds you of your loved one.  Being unable to stop thinking about the death.  Feeling intense anger or guilt.  Feeling alone and hopeless.  Feeling that your life is meaningless and empty.  Losing the desire to move on with your life. How is this diagnosed? This condition may be diagnosed based on:  Your symptoms. Complicated grief will be diagnosed if you have ongoing symptoms of grief for 6-12 months or longer.  The effect of symptoms on your life. You may be diagnosed with this condition if your symptoms are interfering with your ability to live your life. Your health care provider may recommend that you see a mental health care provider. Many symptoms of depression are similar to the symptoms of complicated grief. It is important to be evaluated for complicated grief along with other mental health conditions. How is this treated? This condition is most commonly treated with talk therapy. This therapy is offered by a mental health specialist (psychiatrist). During therapy:  You will learn healthy ways to cope with the loss of your loved one.  Your mental health care provider may recommend antidepressant medicines. Follow these instructions at home: Lifestyle   Take care of yourself. ? Eat on a regular basis, and maintain a healthy diet. Eat plenty of fruits, vegetables, lean protein, and whole grains. ? Try to get some exercise each day. Aim for 30 minutes of exercise on most days of the week. ? Keep a consistent sleep schedule. Try to get 8 or more hours of sleep each night. ? Start doing the things that you used to enjoy.  Do not use drugs or alcohol to ease your symptoms.  Spend time with  friends and loved ones. General instructions  Take over-the-counter and prescription medicines only as told by your health care provider.  Consider joining a grief (bereavement) support group to help you deal with your loss.  Keep all follow-up visits as told by your health care provider. This is important. Contact a health care provider if:  Your symptoms prevent you from functioning normally.  Your symptoms do not get better with treatment. Get help right away if:  You have serious thoughts about hurting yourself or someone else.  You have suicidal feelings. If you ever feel like you may hurt yourself or others, or have thoughts about taking your own life, get help right away. You can go to your nearest emergency department or call:  Your local emergency services (911 in the [place]  A suicide crisis helpline, such as the National Suicide Prevention Lifeline at (737)320-8330. This is open 24 hours a day. Summary  Complicated grief is a severe type of grief that lasts for a long time. This grief is not  likely to go away on its own. Get the help you need.  Some griefs are more difficult than others and can cause this condition. You may need a certain type of treatment to help you recover if the loss of your loved one was sudden, violent, or due to suicide.  You may feel guilty about moving on with your life. Getting help does not mean that you are forgetting your loved one. It means that you are taking care of yourself.  Complicated grief is best treated with talk therapy. Medicines may also be prescribed.  Seek the help you need, and find support that will help you recover. This information is not intended to replace advice given to you by your health care provider. Make sure you discuss any questions you have with your health care provider. Document Revised: 08/16/2017 Document Reviewed: 06/19/2017 Elsevier Patient Education  [place].

## 2019-10-15 LAB — NOVEL CORONAVIRUS, NAA: SARS-CoV-2, NAA: NOT DETECTED

## 2020-01-14 ENCOUNTER — Telehealth (INDEPENDENT_AMBULATORY_CARE_PROVIDER_SITE_OTHER): Payer: POS | Admitting: Medical-Surgical

## 2020-01-14 DIAGNOSIS — Z5329 Procedure and treatment not carried out because of patient's decision for other reasons: Secondary | ICD-10-CM

## 2020-01-15 NOTE — Progress Notes (Signed)
   Complete physical exam  Patient: Cathy Lester   DOB: 07/07/1997   39 y.o. Female  MRN: [id number]  Subjective:    No chief complaint on file.   Cathy Lester is a 39 y.o. female who presents today for a complete physical exam. She reports consuming a {diet types:17450} diet. {types:19826} She generally feels {DESC; WELL/FAIRLY WELL/POORLY:18703}. She reports sleeping {DESC; WELL/FAIRLY WELL/POORLY:18703}. She {does/does not:200015} have additional problems to discuss today.    Most recent fall risk assessment:    03/14/2022   10:42 AM  Fall Risk   Falls in the past year? 0  Number falls in past yr: 0  Injury with Fall? 0  Risk for fall due to : No Fall Risks  Follow up Falls evaluation completed     Most recent depression screenings:    03/14/2022   10:42 AM 02/02/2021   10:46 AM  PHQ 2/9 Scores  PHQ - 2 Score 0 0  PHQ- 9 Score 5     {VISON DENTAL STD PSA (Optional):27386}  {History (Optional):23778}  Patient Care Team: Lanore Renderos, NP as PCP - General (Nurse Practitioner)   Outpatient Medications Prior to Visit  Medication Sig   fluticasone (FLONASE) 50 MCG/ACT nasal spray Place 2 sprays into both nostrils in the morning and at bedtime. After 7 days, reduce to once daily.   norgestimate-ethinyl estradiol (SPRINTEC 28) 0.25-35 MG-MCG tablet Take 1 tablet by mouth daily.   Nystatin POWD Apply liberally to affected area 2 times per day   spironolactone (ALDACTONE) 100 MG tablet Take 1 tablet (100 mg total) by mouth daily.   No facility-administered medications prior to visit.    ROS        Objective:     There were no vitals taken for this visit. {Vitals History (Optional):23777}  Physical Exam   No results found for any visits on 04/19/22. {Show previous labs (optional):23779}    Assessment & Plan:    Routine Health Maintenance and Physical Exam  Immunization History  Administered Date(s) Administered   DTaP 09/20/1999, 11/16/1999,  01/25/2000, 10/10/2000, 04/25/2004   Hepatitis A 02/20/2008, 02/25/2009   Hepatitis B 07/08/1999, 08/15/1999, 01/25/2000   HiB (PRP-OMP) 09/20/1999, 11/16/1999, 01/25/2000, 10/10/2000   IPV 09/20/1999, 11/16/1999, 07/15/2000, 04/25/2004   Influenza,inj,Quad PF,6+ Mos 05/28/2014   Influenza-Unspecified 08/27/2012   MMR 07/15/2001, 04/25/2004   Meningococcal Polysaccharide 02/25/2012   Pneumococcal Conjugate-13 10/10/2000   Pneumococcal-Unspecified 01/25/2000, 04/09/2000   Tdap 02/25/2012   Varicella 07/15/2000, 02/20/2008    Health Maintenance  Topic Date Due   HIV Screening  Never done   Hepatitis C Screening  Never done   INFLUENZA VACCINE  04/17/2022   PAP-Cervical Cytology Screening  04/19/2022 (Originally 07/06/2020)   PAP SMEAR-Modifier  04/19/2022 (Originally 07/06/2020)   TETANUS/TDAP  04/19/2022 (Originally 02/24/2022)   HPV VACCINES  Discontinued   COVID-19 Vaccine  Discontinued    Discussed health benefits of physical activity, and encouraged her to engage in regular exercise appropriate for her age and condition.  Problem List Items Addressed This Visit   None Visit Diagnoses     Annual physical exam    -  Primary   Cervical cancer screening       Need for Tdap vaccination          No follow-ups on file.     Darreon Lutes, NP   

## 2020-02-03 NOTE — Progress Notes (Signed)
Virtual Visit via Video Note  I connected with Cathy Lester on 02/04/20 at 11:10 AM EDT by a video enabled telemedicine application and verified that I am speaking with the correct person using two identifiers.   I discussed the limitations of evaluation and management by telemedicine and the availability of in person appointments. The patient expressed understanding and agreed to proceed.  Subjective:    CC: Upper respiratory symptoms  HPI: Pleasant 39 year old female presenting via MyChart video visit with 3 days of headache, nasal congestion, nausea, diarrhea, body aches, chills, fatigue, sinus/chest congestion, ear fullness, rhinorrhea, postnasal drip, and sore throat.  Symptom onset gradual.  Cough productive of light yellow mucus, worse at night and interfering with her sleep.  Denies fever and vomiting.  Has not had her Covid vaccine but did have a flu vaccine this season.  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  Has tried taking Tylenol Cold and flu with minimal relief.  Past medical history, Surgical history, Family history not pertinant except as noted below, Social history, Allergies, and medications have been entered into the medical record, reviewed, and corrections made.   Review of Systems: See HPI for pertinent positives and negatives.  Objective:    General: Speaking clearly in complete sentences without any shortness of breath.  Alert and oriented x3.  Normal judgment. No apparent acute distress.  Impression and Recommendations:    1. Viral syndrome Likely viral etiology of symptoms.  Recommend Covid testing due to recent exposure.  Zofran 8 mg ODT 3 times daily as needed for nausea.  Promethazine DM for cough.  Discussed supportive treatments.  If no improvement in symptoms by Monday, patient encouraged to return for further evaluation. - ondansetron (ZOFRAN-ODT) 8 MG  disintegrating tablet; Take 1 tablet (8 mg total) by mouth every 8 (eight) hours as needed for nausea.  Dispense: 20 tablet; Refill: 3 - promethazine-dextromethorphan (PROMETHAZINE-DM) 6.25-15 MG/5ML syrup; Take 5 mLs by mouth 4 (four) times daily as needed for cough.  Dispense: 118 mL; Refill: 0  Return if symptoms worsen or fail to improve.  20 minutes of non-face-to-face time was provided during this encounter.  I discussed the assessment and treatment plan with the patient. The patient was provided an opportunity to ask questions and all were answered. The patient agreed with the plan and demonstrated an understanding of the instructions.   The patient was advised to call back or seek an in-person evaluation if the symptoms worsen or if the condition fails to improve as anticipated.  Thayer Ohm, DNP, APRN, FNP-BC  [facility]

## 2020-02-04 ENCOUNTER — Encounter: Payer: Self-pay | Admitting: Medical-Surgical

## 2020-02-04 ENCOUNTER — Telehealth (INDEPENDENT_AMBULATORY_CARE_PROVIDER_SITE_OTHER): Payer: POS | Admitting: Medical-Surgical

## 2020-02-04 DIAGNOSIS — B349 Viral infection, unspecified: Secondary | ICD-10-CM | POA: Diagnosis not present

## 2020-02-04 MED ORDER — ONDANSETRON 8 MG PO TBDP: 8.0000 mg | ORAL_TABLET | Freq: Three times a day (TID) | ORAL | 3 refills | Status: DC | PRN

## 2020-02-04 MED ORDER — PROMETHAZINE-DM 6.25-15 MG/5ML PO SYRP: 5.0000 mL | ORAL_SOLUTION | Freq: Four times a day (QID) | ORAL | 0 refills | Status: DC | PRN

## 2020-02-10 ENCOUNTER — Ambulatory Visit: Payer: POS | Attending: Internal Medicine

## 2020-02-10 DIAGNOSIS — Z20822 Contact with and (suspected) exposure to covid-19: Secondary | ICD-10-CM

## 2020-02-11 LAB — NOVEL CORONAVIRUS, NAA: SARS-CoV-2, NAA: NOT DETECTED

## 2020-02-11 LAB — SARS-COV-2, NAA 2 DAY TAT

## 2020-04-22 ENCOUNTER — Ambulatory Visit (INDEPENDENT_AMBULATORY_CARE_PROVIDER_SITE_OTHER): Payer: POS | Admitting: Medical-Surgical

## 2020-04-22 ENCOUNTER — Other Ambulatory Visit: Payer: Self-pay

## 2020-04-22 VITALS — BP 123/81 | HR 88 | Temp 98.1°F | Ht 67.0 in | Wt 212.7 lb

## 2020-04-22 DIAGNOSIS — R519 Headache, unspecified: Secondary | ICD-10-CM

## 2020-04-22 DIAGNOSIS — G4700 Insomnia, unspecified: Secondary | ICD-10-CM

## 2020-04-22 DIAGNOSIS — R109 Unspecified abdominal pain: Secondary | ICD-10-CM

## 2020-04-22 DIAGNOSIS — H9201 Otalgia, right ear: Secondary | ICD-10-CM | POA: Diagnosis not present

## 2020-04-22 MED ORDER — SUMATRIPTAN SUCCINATE 50 MG PO TABS: ORAL_TABLET | ORAL | 0 refills | Status: DC

## 2020-04-22 MED ORDER — AMITRIPTYLINE HCL 75 MG PO TABS: 150.0000 mg | ORAL_TABLET | Freq: Every evening | ORAL | 1 refills | Status: DC | PRN

## 2020-04-22 MED ORDER — DICYCLOMINE HCL 10 MG PO CAPS: 10.0000 mg | ORAL_CAPSULE | Freq: Three times a day (TID) | ORAL | 1 refills | Status: DC | PRN

## 2020-04-22 MED ORDER — KETOROLAC TROMETHAMINE 60 MG/2ML IM SOLN
30.0000 mg | Freq: Once | INTRAMUSCULAR | Status: AC
  Administered 2020-04-22: 30 mg via INTRAMUSCULAR

## 2020-04-22 MED ORDER — DEXAMETHASONE SODIUM PHOSPHATE 10 MG/ML IJ SOLN
10.0000 mg | Freq: Once | INTRAMUSCULAR | Status: AC
  Administered 2020-04-22: 10 mg via INTRAMUSCULAR

## 2020-04-22 NOTE — Patient Instructions (Signed)
Flonase nasal spray 2 sprays each nostril daily.  Sudafed- instructions on the box

## 2020-04-22 NOTE — Progress Notes (Signed)
Subjective:    CC: headache, right ear pain, stomach cramps  HPI: Cathy Lester 39 year old female presenting today for the following:  Stomach cramps: previously diagnosed with IBS. Was taking Bentyl 10mg , averaging once daily but ran out of medication about two weeks ago. Has had a return of stomach cramps with intermittent diarrhea and constipation. Increased work stress making stomach flare worse.   Headache:- located in bilateral temples, throbbing, mild nausea, x 3 weeks. Has tried tylenol, ibuprofen, minimal relief. Took sumatriptan but didn't start working for about 30 minutes, helped a little but not fully resolve.   Right ear pain: pain in ear and down right neck.for around 3 weeks. Ear popping with some tinnitus. Has tried Tylenol, ibuprofen, and cleaning with peroxide without relief.  I reviewed the past medical history, family history, social history, surgical history, and allergies today and no changes were needed.  Please see the problem list section below in epic for further details.  Past Medical History: Past Medical History:  Diagnosis Date   Colitis    Past Surgical History: No past surgical history on file. Social History: Social History   Socioeconomic History   Marital status: Single    Spouse name: Not on file   Number of children: Not on file   Years of education: Not on file   Highest education level: Not on file  Occupational History   Not on file  Tobacco Use   Smoking status: Current Every Day Smoker    Packs/day: 0.50    Types: Cigarettes   Smokeless tobacco: Never Used  Substance and Sexual Activity   Alcohol use: No    Alcohol/week: 0.0 standard drinks   Drug use: No   Sexual activity: Yes    Partners: Female  Other Topics Concern   Not on file  Social History Narrative   Not on file   Social Determinants of Health   Financial Resource Strain:    Difficulty of Paying Living Expenses:   Food Insecurity:    Worried About  in the Last Year:    Programme researcher, broadcasting/film/video in the Last Year:   Transportation Needs:    Barista (Medical):    Lack of Transportation (Non-Medical):   Physical Activity:    Days of Exercise per Week:    Minutes of Exercise per Session:   Stress:    Feeling of Stress :   Social Connections:    Frequency of Communication with Friends and Family:    Frequency of Social Gatherings with Friends and Family:    Attends Religious Services:    Active Member of Clubs or Organizations:    Attends Freight forwarder Meetings:    Marital Status:    Family History: No family history on file. Allergies: Allergies  Allergen Reactions   Carafate [Sucralfate] Rash    Rash   Morphine And Related Nausea And Vomiting   Medications: See med rec.  Review of Systems: See HPI for pertinent positives and negatives.   Objective:    General: Well Developed, well nourished, and in no acute distress.  Neuro: Alert and oriented x3. EOM intact. Sensation grossly intact. No weakness, numbness, or tingling. HEENT: Normocephalic, atraumatic. Bilateral external ear canals normal. Left TM normal. Right TM mildly bulging with clear fluid. No frontal or maxillary tenderness. Skin: Warm and dry. Cardiac: Regular rate and rhythm, no murmurs rubs or gallops, no lower extremity edema.  Respiratory: Clear to auscultation bilaterally. Not using accessory muscles, speaking  in full sentences.  Impression and Recommendations:    1. Otalgia of right ear Suspect eustachian tube dysfunction with serous middle ear effusion. Recommend using daily Flonase. May benefit from trialing Sudafed or similar decongestant. Tylenol/ibuprofen for pain.  2. Intractable headache, unspecified chronicity pattern, unspecified headache type Migraine cocktail given in office without phenergan as patient drove herself today. Refilling Sumatriptan. Headache possibly related to increased stress but  may also be withdrawal from abrupt cessation of amitriptyline. Refilling amitriptyline. - dexamethasone (DECADRON) injection 10 mg - ketorolac (TORADOL) injection 30 mg  3. Abdominal cramping Resume Bentyl 10-20mg  TID as needed. Refills provided.  4. Insomnia, unspecified type Resume amitriptyline 150mg  nightly for sleep. This may improve sleep which may also help with headaches.  Return in about 6 months (around 10/23/2020) for Insomnia, mood follow up. ___________________________________________ 12/21/2020, DNP, APRN, FNP-BC [facility]

## 2020-09-07 ENCOUNTER — Telehealth: Payer: Self-pay

## 2020-09-07 ENCOUNTER — Telehealth (INDEPENDENT_AMBULATORY_CARE_PROVIDER_SITE_OTHER): Payer: POS | Admitting: Medical-Surgical

## 2020-09-07 ENCOUNTER — Encounter: Payer: Self-pay | Admitting: Medical-Surgical

## 2020-09-07 DIAGNOSIS — J329 Chronic sinusitis, unspecified: Secondary | ICD-10-CM | POA: Diagnosis not present

## 2020-09-07 DIAGNOSIS — J40 Bronchitis, not specified as acute or chronic: Secondary | ICD-10-CM | POA: Diagnosis not present

## 2020-09-07 MED ORDER — HYDROCODONE-HOMATROPINE 5-1.5 MG/5ML PO SYRP: 5.0000 mL | ORAL_SOLUTION | Freq: Three times a day (TID) | ORAL | 0 refills | Status: DC | PRN

## 2020-09-07 MED ORDER — CEFDINIR 300 MG PO CAPS: 300.0000 mg | ORAL_CAPSULE | Freq: Two times a day (BID) | ORAL | 0 refills | Status: DC

## 2020-09-07 MED ORDER — PROMETHAZINE-DM 6.25-15 MG/5ML PO SYRP: 5.0000 mL | ORAL_SOLUTION | Freq: Four times a day (QID) | ORAL | 0 refills | Status: DC | PRN

## 2020-09-07 NOTE — Telephone Encounter (Signed)
Pt had a VV appt and a Rx for Promethazine DM was sent to the pharmacy for her. Originally it was going to be Hycodan but with her allergies, it was changed to Promethazine. Pt said that the SE is nausea, and since she is going to be off for a few days that she is willing to tolerate the nausea in order to get a cough syrup that will work better.

## 2020-09-07 NOTE — Progress Notes (Signed)
Virtual Visit via Telephone   I connected with  Cathy Lester  on 09/07/20 by telephone/telehealth and verified that I am speaking with the correct person using two identifiers.   I discussed the limitations, risks, security and privacy concerns of performing an evaluation and management service by telephone, including the higher likelihood of inaccurate diagnosis and treatment, and the availability of in person appointments.  We also discussed the likely need of an additional face to face encounter for complete and high quality delivery of care.  I also discussed with the patient that there may be a patient responsible charge related to this service. The patient expressed understanding and wishes to proceed.  Provider location is in medical facility. Patient location is at their home, different from provider location. People involved in care of the patient during this telehealth encounter were myself, my nurse/medical assistant, and my front office/scheduling team member.  CC: upper respiratory symptoms  HPI: Pleasant 39 year old female presenting with reports of 10 days of upper respiratory symptoms including sore throat, headache, nasal congestion, body aches, fatigue, cough productive of yellow sputum, and poor appetitie. 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 Unfortunately her symptoms have continued and she has had to leave work early  For the last few days. Notes the symptoms started suddenly last Monday. Is eating and drinking ok without nausea, vomiting, or diarrhea. No fevers/chills but has been feeling hot/cold alternatively. No chest pain or shortness of breath. She does have some tenderness over her sinuses but no facial pain when she bends over.  She has taken Tylenol Cold without relief. No COVID vaccines and has not tested since the start of symptoms.   Review of Systems: See HPI for pertinent positives and negatives.    Objective Findings:    General: Speaking full sentences, no audible heavy breathing.  Sounds alert and appropriately interactive.    Impression and Recommendations:    1. Sinobronchitis Cefdinir 300mg  BID x 7 days. Hycodan cough syrup TID as needed. Supportive measures reviewed. Testing for flu or covid at this point would not give any useful information as she has already reached the 10 day mark and would not change her treatment. Recommend staying home from work to allow some recovery since she is significantly fatigued and run down from long hours. Work note provided to cover time missed from 12/19-12/26, return on 12/27.  I discussed the above assessment and treatment plan with the patient. The patient was provided an opportunity to ask questions and all were answered. The patient agreed with the plan and demonstrated an understanding of the instructions.   The patient was advised to call back or seek an in-person evaluation if the symptoms worsen or if the condition fails to improve as anticipated.  20 minutes of non-face-to-face time was provided during this encounter.  Return if symptoms worsen or fail to improve. ___________________________________________ 1/28, DNP, APRN, FNP-BC Primary Care and Sports Medicine [facility] [place]

## 2020-10-10 ENCOUNTER — Other Ambulatory Visit: Payer: Self-pay

## 2020-10-10 MED ORDER — AMITRIPTYLINE HCL 75 MG PO TABS: 150.0000 mg | ORAL_TABLET | Freq: Every evening | ORAL | 0 refills | Status: DC | PRN

## 2020-10-11 ENCOUNTER — Telehealth (INDEPENDENT_AMBULATORY_CARE_PROVIDER_SITE_OTHER): Payer: POS | Admitting: Medical-Surgical

## 2020-10-11 ENCOUNTER — Encounter: Payer: Self-pay | Admitting: Medical-Surgical

## 2020-10-11 DIAGNOSIS — Z20822 Contact with and (suspected) exposure to covid-19: Secondary | ICD-10-CM

## 2020-10-11 NOTE — Progress Notes (Signed)
Virtual Visit via Video Note  I connected with Cathy Lester on 10/11/20 at 11:10 AM EST by a video enabled telemedicine application and verified that I am speaking with the correct person using two identifiers.   I discussed the limitations of evaluation and management by telemedicine and the availability of in person appointments. The patient expressed understanding and agreed to proceed.  Patient location: home Provider locations: office  Subjective:    CC: Viral symptoms  HPI: Pleasant 40 year old female presenting via MyChart video visit with reports of 2 days of viral symptoms including body aches, and rhinorrhea, and headache.  She is also have a cough which is productive of small amounts of yellowish sputum.  She does have some sinus congestion and postnasal drip as well.  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  She is not sure about what she may have been exposed to.  She is using over-the-counter cough syrups/cold and flu preparations.  Has also started taking Mucinex.  All of her over-the-counter medications are helping some.  She has not Covid tested yet.  Denies fever, chills, shortness of breath, chest pain, and GI symptoms.  Past medical history, Surgical history, Family history not pertinant except as noted below, Social history, Allergies, and medications have been entered into the medical record, reviewed, and corrections made.   Review of Systems: See HPI for pertinent positives and negatives.   Objective:    General: Speaking clearly in complete sentences without any shortness of breath.  Alert and oriented x3.  Normal judgment. No apparent acute distress.  Impression and Recommendations:    1. Suspected COVID-19 virus infection Strongly recommend Covid testing.  Discussed options for testing at this time including home testing.  Website provided for [facility] Covid testing.  Patient verbalized intent to go  on and schedule an appointment to get tested.  Discussed symptomatic treatment.  Sending a MyChart message with recommended management of symptoms and monitoring recommendations.  Okay to continue over-the-counter medications at this time.  If these become unhelpful, advised patient to contact me and I will call in prescriptions that may help.  Unfortunately our nearby pharmacies are out of prescription cough syrups and they are on backorder at this time.  I discussed the assessment and treatment plan with the patient. The patient was provided an opportunity to ask questions and all were answered. The patient agreed with the plan and demonstrated an understanding of the instructions.   The patient was advised to call back or seek an in-person evaluation if the symptoms worsen or if the condition fails to improve as anticipated.  20 minutes of non-face-to-face time was provided during this encounter.  Return if symptoms worsen or fail to improve.  Thayer Ohm, DNP, APRN, FNP-BC [facility] and Sports Medicine

## 2020-10-20 ENCOUNTER — Other Ambulatory Visit: Payer: Self-pay

## 2020-10-20 ENCOUNTER — Ambulatory Visit (HOSPITAL_COMMUNITY)
Admission: EM | Admit: 2020-10-20 | Discharge: 2020-10-20 | Disposition: A | Discharge status: Home / Self Care | Payer: POS

## 2020-10-20 ENCOUNTER — Encounter (HOSPITAL_COMMUNITY): Payer: Self-pay

## 2020-10-20 ENCOUNTER — Ambulatory Visit: Payer: Self-pay

## 2020-10-20 DIAGNOSIS — M79671 Pain in right foot: Secondary | ICD-10-CM

## 2020-10-20 DIAGNOSIS — L84 Corns and callosities: Secondary | ICD-10-CM

## 2020-10-20 NOTE — ED Provider Notes (Signed)
[facility]    CSN: [id number] Arrival date & time: 10/20/20  1325      History   Chief Complaint Chief Complaint  Patient presents with  . Foot Pain    HPI Cathy Lester is a 40 y.o. female.   Patient presents with right foot pain due to a callus on the bottom of her foot for several months.  No falls or injury.  She states she was seen by a podiatrist once a few months ago and was treated with a cream.  She denies numbness, weakness, paresthesias, redness, fever, or other symptoms.  Her medical history includes colitis, insomnia, anxiety, depression.  The history is provided by the patient.    Past Medical History:  Diagnosis Date  . Colitis     Patient Active Problem List   Diagnosis Date Noted  . History of depression 06/11/2018  . GAD (generalized anxiety disorder) 03/26/2018  . Insomnia 08/13/2016  . Fatigue 02/07/2016  . Colitis 07/22/2015    History reviewed. No pertinent surgical history.  OB History   No obstetric history on file.      Home Medications    Prior to Admission medications   Medication Sig Start Date End Date Taking? Authorizing Provider  Albuterol Sulfate (PROAIR RESPICLICK) 108 (90 Base) MCG/ACT AEPB Inhale 1 puff into the lungs every 6 (six) hours as needed (shortness of breath or wheezing). 09/22/18   Rodolph Bong, MD  amitriptyline (ELAVIL) 75 MG tablet Take 2 tablets (150 mg total) by mouth at bedtime as needed for sleep. **PATIENT NEEDS OFFICE VISIT FOR ADDITIONAL REFILLS** 10/10/20   Christen Butter, NP  dicyclomine (BENTYL) 10 MG capsule Take 1-2 capsules (10-20 mg total) by mouth 3 (three) times daily as needed for spasms (abdominal cramps). 04/22/20 07/21/20  Christen Butter, NP  omeprazole (PRILOSEC) 40 MG capsule Take 1 capsule (40 mg total) by mouth daily. 01/15/18   Rodolph Bong, MD  ondansetron (ZOFRAN-ODT) 8 MG disintegrating tablet Take 1 tablet (8 mg total) by mouth every 8 (eight) hours as needed for nausea. 02/04/20   Christen Butter, NP  SUMAtriptan (IMITREX) 50 MG tablet Take 1 tablet by mouth at onset of migraine. May repeat in 2 hours if headache persists or recurs. 04/22/20   Christen Butter, NP    Family History History reviewed. No pertinent family history.  Social History Social History   Tobacco Use  . Smoking status: Current Every Day Smoker    Packs/day: 0.50    Types: Cigarettes  . Smokeless tobacco: Never Used  Substance Use Topics  . Alcohol use: No    Alcohol/week: 0.0 standard drinks  . Drug use: No     Allergies   Carafate [sucralfate] and Morphine and related   Review of Systems Review of Systems  Constitutional: Negative for chills and fever.  HENT: Negative for ear pain and sore throat.   Eyes: Negative for pain and visual disturbance.  Respiratory: Negative for cough and shortness of breath.   Cardiovascular: Negative for chest pain and palpitations.  Gastrointestinal: Negative for abdominal pain and vomiting.  Genitourinary: Negative for dysuria and hematuria.  Musculoskeletal: Positive for gait problem. Negative for arthralgias and back pain.  Skin: Negative for color change and rash.  Neurological: Negative for seizures and syncope.  All other systems reviewed and are negative.    Physical Exam Triage Vital Signs ED Triage Vitals  Enc Vitals Group     BP      Pulse  Resp      Temp      Temp src      SpO2      Weight      Height      Head Circumference      Peak Flow      Pain Score      Pain Loc      Pain Edu?      Excl. in GC?    No data found.  Updated Vital Signs BP (!) 129/91   Pulse 86   Temp 98.7 F (37.1 C)   Resp 18   LMP 09/21/2020   SpO2 100%   Visual Acuity Right Eye Distance:   Left Eye Distance:   Bilateral Distance:    Right Eye Near:   Left Eye Near:    Bilateral Near:     Physical Exam Vitals and nursing note reviewed.  Constitutional:      General: She is not in acute distress.    Appearance: She is well-developed and  well-nourished.  HENT:     Head: Normocephalic and atraumatic.     Mouth/Throat:     Mouth: Mucous membranes are moist.  Eyes:     Conjunctiva/sclera: Conjunctivae normal.  Cardiovascular:     Rate and Rhythm: Normal rate and regular rhythm.     Heart sounds: Normal heart sounds.  Pulmonary:     Effort: Pulmonary effort is normal. No respiratory distress.     Breath sounds: Normal breath sounds.  Abdominal:     Palpations: Abdomen is soft.     Tenderness: There is no abdominal tenderness.  Musculoskeletal:        General: Tenderness present. No swelling, deformity or edema. Normal range of motion.     Cervical back: Neck supple.       Feet:     Comments: Callus tender to palpation. No erythema or drainage.   Skin:    General: Skin is warm and dry.     Capillary Refill: Capillary refill takes less than 2 seconds.     Findings: No bruising, erythema or rash.  Neurological:     General: No focal deficit present.     Mental Status: She is alert and oriented to person, place, and time.     Sensory: No sensory deficit.     Motor: No weakness.     Gait: Gait abnormal.     Comments: Limping gait.  Psychiatric:        Mood and Affect: Mood and affect and mood normal.        Behavior: Behavior normal.      UC Treatments / Results  Labs (all labs ordered are listed, but only abnormal results are displayed) Labs Reviewed - No data to display  EKG   Radiology No results found.  Procedures Procedures (including critical care time)  Medications Ordered in UC Medications - No data to display  Initial Impression / Assessment and Plan / UC Course  I have reviewed the triage vital signs and the nursing notes.  Pertinent labs & imaging results that were available during my care of the patient were reviewed by me and considered in my medical decision making (see chart for details).   Right foot pain due to callus.  Discussed with patient that she needs to be seen by a  podiatrist.  Instructed her to take Tylenol or ibuprofen as needed for discomfort.  She agrees to plan of care.   Final Clinical Impressions(s) / UC  Diagnoses   Final diagnoses:  Foot pain, right  Foot callus     Discharge Instructions     Take Tylenol or ibuprofen as needed for discomfort.    Schedule an appointment with a podiatrist as soon as possible.        ED Prescriptions    None     PDMP not reviewed this encounter.   Mickie Bail, NP 10/20/20 [phone]

## 2020-10-20 NOTE — Discharge Instructions (Signed)
Take Tylenol or ibuprofen as needed for discomfort.    Schedule an appointment with a podiatrist as soon as possible.

## 2020-10-20 NOTE — ED Triage Notes (Signed)
Pt in with c/o right foot pain that has been going on for a few months. States that it feels like a callus on the bottom of her foot and the pain has been getting worse  Pt has not had medication for sxs

## 2021-02-06 ENCOUNTER — Other Ambulatory Visit: Payer: Self-pay | Admitting: Medical-Surgical

## 2021-02-22 ENCOUNTER — Encounter (HOSPITAL_COMMUNITY): Payer: Self-pay | Admitting: *Deleted

## 2021-02-22 ENCOUNTER — Emergency Department (HOSPITAL_COMMUNITY)
Admission: EM | Admit: 2021-02-22 | Discharge: 2021-02-22 | Disposition: A | Discharge status: Home / Self Care | Payer: POS | Attending: Emergency Medicine | Admitting: Emergency Medicine

## 2021-02-22 ENCOUNTER — Other Ambulatory Visit: Payer: Self-pay

## 2021-02-22 DIAGNOSIS — Z1152 Encounter for screening for COVID-19: Secondary | ICD-10-CM | POA: Diagnosis not present

## 2021-02-22 DIAGNOSIS — Z28310 Unvaccinated for covid-19: Secondary | ICD-10-CM | POA: Insufficient documentation

## 2021-02-22 DIAGNOSIS — F17210 Nicotine dependence, cigarettes, uncomplicated: Secondary | ICD-10-CM | POA: Insufficient documentation

## 2021-02-22 DIAGNOSIS — Z20822 Contact with and (suspected) exposure to covid-19: Secondary | ICD-10-CM | POA: Diagnosis not present

## 2021-02-22 LAB — RESP PANEL BY RT-PCR (FLU A&B, COVID) ARPGX2
Influenza A by PCR: NEGATIVE
Influenza B by PCR: NEGATIVE
SARS Coronavirus 2 by RT PCR: NEGATIVE

## 2021-02-22 NOTE — ED Notes (Signed)
Pt discharged from this ED in stable condition at this time. All discharge instructions and follow up care reviewed with pt with no further questions at this time. Pt ambulatory with steady gait, clear speech.  

## 2021-02-22 NOTE — ED Triage Notes (Signed)
Pt complains of headache, sore throat, cough, midabdominal pain and diarrhea x 3 days. She became concerned when her friend tested positive for COVID today.

## 2021-02-22 NOTE — ED Provider Notes (Signed)
[facility]EMER[facility]ENCY [facility]PT Provider Note   CSN: [id number] Arrival date & time: 02/22/21  0909     History Chief Complaint  Patient presents with  . Covid Exposure  . Headache    Cathy Lester is a 40 y.o. female.  40 year old female with prior medical history as detailed below presents for evaluation.  Patient is concerned that she may have COVID.  She reports that she has a roommate who apparently had a home COVID test that was positive this morning.  Patient is with minimal symptoms at this time.  She request COVID testing.  The patient is reporting that she is unvaccinated for COVID.  Patient declines to wait for her COVID result.  She understands how to obtain her results through the MyChart system.  The history is provided by the patient and medical records.  Illness Location:  Request for COVID screening Severity:  Mild Onset quality:  Unable to specify Timing:  Unable to specify Progression:  Unable to specify Chronicity:  New      Past Medical History:  Diagnosis Date  . Colitis     Patient Active Problem List   Diagnosis Date Noted  . History of depression 06/11/2018  . GAD (generalized anxiety disorder) 03/26/2018  . Insomnia 08/13/2016  . Fatigue 02/07/2016  . Colitis 07/22/2015    History reviewed. No pertinent surgical history.   OB History   No obstetric history on file.     No family history on file.  Social History   Tobacco Use  . Smoking status: Current Every Day Smoker    Packs/day: 0.50    Types: Cigarettes  . Smokeless tobacco: Never Used  Substance Use Topics  . Alcohol use: No    Alcohol/week: 0.0 standard drinks  . Drug use: No    Home Medications Prior to Admission medications   Medication Sig Start Date End Date Taking? Authorizing Provider  Albuterol Sulfate (PROAIR RESPICLICK) 108 (90 Base) MCG/ACT AEPB Inhale 1 puff into the lungs every 6 (six) hours as needed (shortness of breath or wheezing).  09/22/18   Rodolph Bong, MD  amitriptyline (ELAVIL) 75 MG tablet Take 2 tablets (150 mg total) by mouth at bedtime as needed for sleep. **PATIENT NEEDS OFFICE VISIT FOR ADDITIONAL REFILLS** 10/10/20   Christen Butter, NP  dicyclomine (BENTYL) 10 MG capsule Take 1-2 capsules (10-20 mg total) by mouth 3 (three) times daily as needed for spasms (abdominal cramps). 04/22/20 07/21/20  Christen Butter, NP  omeprazole (PRILOSEC) 40 MG capsule Take 1 capsule (40 mg total) by mouth daily. 01/15/18   Rodolph Bong, MD  ondansetron (ZOFRAN-ODT) 8 MG disintegrating tablet Take 1 tablet (8 mg total) by mouth every 8 (eight) hours as needed for nausea. 02/04/20   Christen Butter, NP  SUMAtriptan (IMITREX) 50 MG tablet Take 1 tablet by mouth at onset of migraine. May repeat in 2 hours if headache persists or recurs. 04/22/20   Christen Butter, NP    Allergies    Carafate [sucralfate] and Morphine and related  Review of Systems   Review of Systems  All other systems reviewed and are negative.   Physical Exam Updated Vital Signs BP (!) 139/92 (BP Location: Right Arm)   Pulse 69   Temp 98.5 F (36.9 C) (Oral)   Resp 18   SpO2 100%   Physical Exam Vitals and nursing note reviewed.  Constitutional:      General: She is not in acute distress.  Appearance: She is well-developed.  HENT:     Head: Normocephalic and atraumatic.  Eyes:     General: No visual field deficit.    Conjunctiva/sclera: Conjunctivae normal.     Pupils: Pupils are equal, round, and reactive to light.  Cardiovascular:     Rate and Rhythm: Normal rate and regular rhythm.     Heart sounds: Normal heart sounds.  Pulmonary:     Effort: Pulmonary effort is normal. No respiratory distress.     Breath sounds: Normal breath sounds.  Abdominal:     General: There is no distension.     Palpations: Abdomen is soft.     Tenderness: There is no abdominal tenderness.  Musculoskeletal:        General: No deformity. Normal range of motion.     Cervical back:  Normal range of motion and neck supple.  Skin:    General: Skin is warm and dry.  Neurological:     Mental Status: She is alert and oriented to person, place, and time.     GCS: GCS eye subscore is 4. GCS verbal subscore is 5. GCS motor subscore is 6.     Cranial Nerves: No cranial nerve deficit, dysarthria or facial asymmetry.     Sensory: No sensory deficit.     Motor: No weakness.     ED Results / Procedures / Treatments   Labs (all labs ordered are listed, but only abnormal results are displayed) Labs Reviewed  RESP PANEL BY RT-PCR (FLU A&B, COVID) [id number]    EKG None  Radiology No results found.  Procedures Procedures   Medications Ordered in ED Medications - No data to display  ED Course  I have reviewed the triage vital signs and the nursing notes.  Pertinent labs & imaging results that were available during my care of the patient were reviewed by me and considered in my medical decision making (see chart for details).    MDM Rules/Calculators/A&P                          MDM  MSE complete  Cathy Lester was evaluated in Emergency Department on 02/22/2021 for the symptoms described in the history of present illness. She was evaluated in the context of the global COVID-19 pandemic, which necessitated consideration that the patient might be at risk for infection with the SARS-CoV-2 virus that causes COVID-19. Institutional protocols and algorithms that pertain to the evaluation of patients at risk for COVID-19 are in a state of rapid change based on information released by regulatory bodies including the CDC and federal and state organizations. These policies and algorithms were followed during the patient's care in the ED.  Patient is requesting COVID screen.  She has a close contact who apparently is testing positive.  She has minimal to no symptoms at this time.  She understands how to follow-up her results to the MyChart system. She declines to wait for  results of PCR test.  Importance of close follow-up with her PCP is stressed.  Strict return precautions given and understood.  Basics of COVID management discussed extensively with patient.  Quarantine practices encouraged.    Final Clinical Impression(s) / ED Diagnoses Final diagnoses:  Encounter for screening for COVID-19    Rx / DC Orders ED Discharge Orders    None       Wynetta Fines, MD 02/22/21 [phone]

## 2021-02-22 NOTE — Discharge Instructions (Addendum)
Return for any problem.  ?

## 2021-02-23 ENCOUNTER — Telehealth: Payer: Self-pay

## 2021-02-23 NOTE — Telephone Encounter (Signed)
Transition Care Management Unsuccessful Follow-up Telephone Call  Date of discharge and from where:  02/22/2021 from [facility]  Attempts:  1st Attempt  Reason for unsuccessful TCM follow-up call:  Left voice message

## 2021-02-24 NOTE — Telephone Encounter (Signed)
Transition Care Management Unsuccessful Follow-up Telephone Call  Date of discharge and from where:  02/22/2021 from [facility]  Attempts:  2nd Attempt  Reason for unsuccessful TCM follow-up call:  Left voice message    

## 2021-02-27 ENCOUNTER — Encounter: Payer: Self-pay | Admitting: Medical-Surgical

## 2021-02-27 ENCOUNTER — Telehealth (INDEPENDENT_AMBULATORY_CARE_PROVIDER_SITE_OTHER): Payer: POS | Admitting: Medical-Surgical

## 2021-02-27 DIAGNOSIS — U071 COVID-19: Secondary | ICD-10-CM | POA: Diagnosis not present

## 2021-02-27 MED ORDER — BENZONATATE 200 MG PO CAPS: 200.0000 mg | ORAL_CAPSULE | Freq: Three times a day (TID) | ORAL | 0 refills | Status: DC | PRN

## 2021-02-27 MED ORDER — HYDROCOD POLST-CPM POLST ER 10-8 MG/5ML PO SUER: 5.0000 mL | Freq: Two times a day (BID) | ORAL | 0 refills | Status: DC | PRN

## 2021-02-27 NOTE — Telephone Encounter (Signed)
Transition Care Management Unsuccessful Follow-up Telephone Call  Date of discharge and from where:  02/22/2021 from [facility]  Attempts:  3rd Attempt  Reason for unsuccessful TCM follow-up call:  Unable to reach patient

## 2021-02-27 NOTE — Progress Notes (Signed)
Virtual Visit via Video Note  I connected with Cathy Lester on 02/27/21 at  4:20 PM EDT by a video enabled telemedicine application and verified that I am speaking with the correct person using two identifiers.   I discussed the limitations of evaluation and management by telemedicine and the availability of in person appointments. The patient expressed understanding and agreed to proceed.  Patient location: home Provider locations: office  Subjective:    CC: COVID-19 positive  HPI: Pleasant 40 year old female presenting via MyChart video visit with reports of testing positive for COVID yesterday.  Her symptoms originally started approximately 4 to 5 days ago.  She did several home test for COVID but they were all negative.  She decided to go and have a test performed and received her positive results today.  She currently is experiencing severe headache, body aches cough, sore throat, diarrhea, and fever T-max 102.  Has been using Tylenol and ibuprofen as needed for symptom management.  Since she was originally scheduled for tomorrow and her significant other had an appointment today.  We agreed to move her appointment up so that she gets earlier symptom relief.  Past medical history, Surgical history, Family history not pertinant except as noted below, Social history, Allergies, and medications have been entered into the medical record, reviewed, and corrections made.   Review of Systems: See HPI for pertinent positives and negatives.   Objective:    General: Speaking clearly in complete sentences without any shortness of breath.  Alert and oriented x3.  Normal judgment. No apparent acute distress.  Impression and Recommendations:    1. COVID-19 virus infection Discussed the use of the oral antivirals but patient is not interested in trying those.  Because she is still early in the timeline for symptoms, avoiding oral steroids right now.  Discussed symptomatic treatment.  Sending in  [place] and Tussionex to help with cough.  Continue ibuprofen and Tylenol as needed for fever and body aches.  Recommend nasal saline spray and Mucinex or Sudafed to help with congestion.  I discussed the assessment and treatment plan with the patient. The patient was provided an opportunity to ask questions and all were answered. The patient agreed with the plan and demonstrated an understanding of the instructions.   The patient was advised to call back or seek an in-person evaluation if the symptoms worsen or if the condition fails to improve as anticipated.  20 minutes of non-face-to-face time was provided during this encounter.  Return if symptoms worsen or fail to improve.  Thayer Ohm, DNP, APRN, FNP-BC [facility] and Sports Medicine

## 2021-02-28 ENCOUNTER — Encounter: Payer: Self-pay | Admitting: Medical-Surgical

## 2021-02-28 ENCOUNTER — Telehealth: Payer: POS | Admitting: Medical-Surgical

## 2021-10-13 ENCOUNTER — Other Ambulatory Visit: Payer: Self-pay

## 2021-10-13 ENCOUNTER — Ambulatory Visit (INDEPENDENT_AMBULATORY_CARE_PROVIDER_SITE_OTHER): Payer: POS | Admitting: Physician Assistant

## 2021-10-13 VITALS — BP 137/65 | HR 87 | Temp 99.0°F | Ht 67.0 in | Wt 198.0 lb

## 2021-10-13 DIAGNOSIS — J40 Bronchitis, not specified as acute or chronic: Secondary | ICD-10-CM | POA: Diagnosis not present

## 2021-10-13 DIAGNOSIS — R110 Nausea: Secondary | ICD-10-CM | POA: Diagnosis not present

## 2021-10-13 DIAGNOSIS — G4700 Insomnia, unspecified: Secondary | ICD-10-CM

## 2021-10-13 DIAGNOSIS — B349 Viral infection, unspecified: Secondary | ICD-10-CM | POA: Insufficient documentation

## 2021-10-13 DIAGNOSIS — J329 Chronic sinusitis, unspecified: Secondary | ICD-10-CM

## 2021-10-13 MED ORDER — AZITHROMYCIN 250 MG PO TABS: ORAL_TABLET | ORAL | 0 refills | Status: DC

## 2021-10-13 MED ORDER — AMITRIPTYLINE HCL 75 MG PO TABS: 150.0000 mg | ORAL_TABLET | Freq: Every evening | ORAL | 1 refills | Status: DC | PRN

## 2021-10-13 MED ORDER — HYDROCOD POLI-CHLORPHE POLI ER 10-8 MG/5ML PO SUER: 5.0000 mL | Freq: Two times a day (BID) | ORAL | 0 refills | Status: DC | PRN

## 2021-10-13 MED ORDER — ONDANSETRON 8 MG PO TBDP: 8.0000 mg | ORAL_TABLET | Freq: Three times a day (TID) | ORAL | 1 refills | Status: DC | PRN

## 2021-10-13 NOTE — Patient Instructions (Signed)

## 2021-10-13 NOTE — Progress Notes (Signed)
Subjective:     Patient ID: Cathy Lester, female   DOB: [birth year], 41 y.o.   MRN: [id number]  Cough Associated symptoms include chest pain, ear pain, rhinorrhea and a sore throat. Pertinent negatives include no shortness of breath.  41 YO female presents to the clinic for sinus congestion x1 week. Patient tried pseudafed, mucinex, cold and flu, and nyquil that did not help. Patient admits to sinus congestion with yellow mucus production, chest congestion, cough, sore throat, headache, nausea, ear pain, cough, scratchy throat. She does not feel like she is getting better.   Patient admits to chronic intermittent chest pain that is associated with stress from work. She reports it gets better with rest and that she FMLA at work so she is able to take time off as she needs.   Patient needs a refill on her zofran and amitriptyline.   Review of Systems  HENT:  Positive for congestion, ear pain, rhinorrhea, sinus pressure, sinus pain and sore throat.   Respiratory:  Positive for cough. Negative for shortness of breath.   Cardiovascular:  Positive for chest pain.  Gastrointestinal:  Positive for nausea. Negative for abdominal pain, constipation, diarrhea and vomiting.      Objective:   Physical Exam Constitutional:      General: She is not in acute distress.    Appearance: Normal appearance. She is obese. She is not ill-appearing, toxic-appearing or diaphoretic.  HENT:     Head: Normocephalic and atraumatic.     Right Ear: Tympanic membrane normal.     Left Ear: Tympanic membrane normal.     Ears:     Comments: Bilateral ceruminosis, normal Tms bilateral    Nose: Congestion present.     Right Turbinates: Swollen.     Left Turbinates: Swollen.     Right Sinus: Maxillary sinus tenderness and frontal sinus tenderness present.     Left Sinus: Maxillary sinus tenderness and frontal sinus tenderness present.     Comments: Turbinates erythematous    Mouth/Throat:     Mouth: Mucous membranes  are moist.     Pharynx: No oropharyngeal exudate or posterior oropharyngeal erythema.  Eyes:     Conjunctiva/sclera: Conjunctivae normal.  Cardiovascular:     Rate and Rhythm: Normal rate and regular rhythm.     Pulses: Normal pulses.     Heart sounds: Normal heart sounds.  Pulmonary:     Effort: Pulmonary effort is normal.     Breath sounds: Normal breath sounds.  Musculoskeletal:        General: Normal range of motion.  Skin:    General: Skin is warm and dry.  Neurological:     Mental Status: She is alert and oriented to person, place, and time.  Psychiatric:        Mood and Affect: Mood normal.        Behavior: Behavior normal.        Thought Content: Thought content normal.        Judgment: Judgment normal.   .. Today's Vitals   10/13/21 1558  BP: 137/65  Pulse: 87  Temp: 99 F (37.2 C)  TempSrc: Oral  SpO2: 99%  Weight: 198 lb (89.8 kg)  Height: 5\' 7"  (1.702 m)   Body mass index is 31.01 kg/m.     Assessment:     Marland KitchenCharlena was seen today for cough.  Diagnoses and all orders for this visit:  Sinobronchitis -     azithromycin (ZITHROMAX Z-PAK) 250 MG tablet; Take 2 tablets (  500 mg) on  Day 1,  followed by 1 tablet (250 mg) once daily on Days 2 through 5. -     chlorpheniramine-HYDROcodone 10-8 MG/5ML; Take 5 mLs by mouth every 12 (twelve) hours as needed for cough (cough, will cause drowsiness.).  Nausea -     ondansetron (ZOFRAN-ODT) 8 MG disintegrating tablet; Take 1 tablet (8 mg total) by mouth every 8 (eight) hours as needed for nausea.  Insomnia, unspecified type -     amitriptyline (ELAVIL) 75 MG tablet; Take 2 tablets (150 mg total) by mouth at bedtime as needed for sleep.       Plan:     Start tussionex for cough suppression. Start azithromycin.   Educated patient on saline rinses and benefit of deep breathing.   Refill zofran and amitriptyline.   Educated patient on OTC ashwaghanda and calm aid for anxiety.   Follow up if symptoms persist  or worsen.

## 2021-10-16 ENCOUNTER — Encounter: Payer: Self-pay | Admitting: Physician Assistant

## 2021-12-14 NOTE — Progress Notes (Signed)
Canceled appointment.  Please disregard ?

## 2021-12-14 NOTE — Patient Instructions (Signed)
Erroneous encounter.  Canceled appointment please disregard ? ?___________________________________________ ?Thayer Ohm, DNP, APRN, FNP-BC ?Primary Care and Sports Medicine ?[name] ? ?

## 2021-12-15 ENCOUNTER — Encounter: Payer: POS | Admitting: Medical-Surgical

## 2021-12-15 DIAGNOSIS — Z1329 Encounter for screening for other suspected endocrine disorder: Secondary | ICD-10-CM

## 2021-12-15 DIAGNOSIS — Z13220 Encounter for screening for lipoid disorders: Secondary | ICD-10-CM

## 2021-12-15 DIAGNOSIS — Z131 Encounter for screening for diabetes mellitus: Secondary | ICD-10-CM

## 2021-12-15 DIAGNOSIS — Z0000 Encounter for general adult medical examination without abnormal findings: Secondary | ICD-10-CM

## 2022-01-30 ENCOUNTER — Other Ambulatory Visit: Payer: Self-pay | Admitting: Medical-Surgical

## 2022-04-20 ENCOUNTER — Telehealth (INDEPENDENT_AMBULATORY_CARE_PROVIDER_SITE_OTHER): Payer: POS | Admitting: Medical-Surgical

## 2022-04-20 ENCOUNTER — Encounter: Payer: Self-pay | Admitting: Medical-Surgical

## 2022-04-20 DIAGNOSIS — K219 Gastro-esophageal reflux disease without esophagitis: Secondary | ICD-10-CM | POA: Diagnosis not present

## 2022-04-20 DIAGNOSIS — G4700 Insomnia, unspecified: Secondary | ICD-10-CM

## 2022-04-20 DIAGNOSIS — M79673 Pain in unspecified foot: Secondary | ICD-10-CM

## 2022-04-20 MED ORDER — OMEPRAZOLE 40 MG PO CPDR: 40.0000 mg | DELAYED_RELEASE_CAPSULE | Freq: Every day | ORAL | 1 refills | Status: DC

## 2022-04-20 MED ORDER — AMITRIPTYLINE HCL 75 MG PO TABS: 150.0000 mg | ORAL_TABLET | Freq: Every evening | ORAL | 0 refills | Status: DC | PRN

## 2022-04-20 NOTE — Progress Notes (Signed)
Virtual Visit via Video Note  I connected with Cathy Lester on 04/20/22 at 11:10 AM EDT by a video enabled telemedicine application and verified that I am speaking with the correct person using two identifiers.   I discussed the limitations of evaluation and management by telemedicine and the availability of in person appointments. The patient expressed understanding and agreed to proceed.  Patient location: home Provider locations: office  Subjective:    CC: Insomnia follow-up  HPI: Pleasant 41 year old female presenting via MyChart video visit to discuss insomnia.  She has been taking amitriptyline 150 mg nightly as needed for sleep.  She does usually take this every night and reports that the medication does help for the most part.  Has no side effects or significant concerns while on the medication.  Notes that she has some significant foot pain and has noted a callus on the bottom of her foot that seems to be causing the issue.  She has a podiatrist appointment in October however that the pain is very insistent and is interfering with her ability to perform at work.  She is interested in other options to help reduce her pain while she is waiting to get in with podiatry.  On Tuesday and Wednesday of this week, she had some significant stomach upset and menstrual issues that led to her missing work.  She did try to call our office and get an appointment but there were no available time slots.  She is requesting a note to excuse her from work for her absences.  Requesting refill on omeprazole 40 mg daily as needed.  She has been taking this for quite a while and it seems to help tremendously with her reflux.  Past medical history, Surgical history, Family history not pertinant except as noted below, Social history, Allergies, and medications have been entered into the medical record, reviewed, and corrections made.   Review of Systems: See HPI for pertinent positives and negatives.    Objective:    General: Speaking clearly in complete sentences without any shortness of breath.  Alert and oriented x3.  Normal judgment. No apparent acute distress.  Impression and Recommendations:    1. Insomnia, unspecified type Symptoms fairly stable.  Continue amitriptyline 150 mg nightly as needed for sleep. - amitriptyline (ELAVIL) 75 MG tablet; Take 2 tablets (150 mg total) by mouth at bedtime as needed for sleep.  Dispense: 180 tablet; Refill: 0  2. Pain of foot, unspecified laterality Unable to examine due to virtual nature of the visit.  Advised her to set an in office visit where we can evaluate and possibly shave down the callus that seems to be causing her pain.  I would still like to get her in with podiatry for more definitive treatment however this may give her some relief in the meantime.  3. Gastroesophageal reflux disease without esophagitis Refilling omeprazole 40 mg daily as needed.  I discussed the assessment and treatment plan with the patient. The patient was provided an opportunity to ask questions and all were answered. The patient agreed with the plan and demonstrated an understanding of the instructions.   The patient was advised to call back or seek an in-person evaluation if the symptoms worsen or if the condition fails to improve as anticipated.  25 minutes of non-face-to-face time was provided during this encounter.  Return for Foot pain evaluation at your earliest convenience.  Thayer Ohm, DNP, APRN, FNP-BC [facility]

## 2022-06-26 ENCOUNTER — Telehealth (INDEPENDENT_AMBULATORY_CARE_PROVIDER_SITE_OTHER): Payer: POS | Admitting: Family Medicine

## 2022-06-26 ENCOUNTER — Encounter: Payer: Self-pay | Admitting: Family Medicine

## 2022-06-26 DIAGNOSIS — R062 Wheezing: Secondary | ICD-10-CM

## 2022-06-26 DIAGNOSIS — U071 COVID-19: Secondary | ICD-10-CM | POA: Diagnosis not present

## 2022-06-26 DIAGNOSIS — R051 Acute cough: Secondary | ICD-10-CM

## 2022-06-26 MED ORDER — BENZONATATE 100 MG PO CAPS: 100.0000 mg | ORAL_CAPSULE | Freq: Three times a day (TID) | ORAL | 0 refills | Status: AC | PRN

## 2022-06-26 MED ORDER — GUAIFENESIN-DM 100-10 MG/5ML PO SYRP
5.0000 mL | ORAL_SOLUTION | Freq: Four times a day (QID) | ORAL | 0 refills | Status: DC | PRN
Start: 2022-06-26 — End: 2022-11-23

## 2022-06-26 MED ORDER — PROAIR RESPICLICK 108 (90 BASE) MCG/ACT IN AEPB: 1.0000 | INHALATION_SPRAY | Freq: Four times a day (QID) | RESPIRATORY_TRACT | 1 refills | Status: DC | PRN

## 2022-06-26 NOTE — Assessment & Plan Note (Addendum)
-   recommended supportive care - ordered inhaler refill for her wheezing and robitussin dm for cough  - pt needs work excuse from 10/5 to 10/12. Told her I would be able to write from 10/10 but would state she reported illness starting on 10/5 - gave ED precautions

## 2022-06-26 NOTE — Progress Notes (Signed)
   Acute Office Visit  Subjective:     Patient ID: Cathy Lester, female    DOB: 05/18/1981, 41 y.o.   MRN: [id number]  Chief Complaint  Patient presents with   Covid Exposure   I connected with  Felipa Emory on 06/26/22 by a video and audio enabled telemedicine application and verified that I am speaking with the correct person using two identifiers.  Patient Location: Home  Provider Location: Office/Clinic  I discussed the limitations of evaluation and management by telemedicine. The patient expressed understanding and agreed to proceed.    Pt on video visit today after taking a home test for covid that was positive. She took the home test on 10/6th.       Objective:    There were no vitals taken for this visit.   Physical Exam Vitals reviewed.  Constitutional:      Appearance: She is well-developed.  HENT:     Head: Normocephalic and atraumatic.  Eyes:     Conjunctiva/sclera: Conjunctivae normal.  Pulmonary:     Effort: Pulmonary effort is normal.     Comments: Breathing without difficulty Skin:    General: Skin is dry.     Coloration: Skin is not pale.  Neurological:     Mental Status: She is alert and oriented to person, place, and time.  Psychiatric:        Behavior: Behavior normal.     No results found for any visits on 06/26/22.      Assessment & Plan:   Problem List Items Addressed This Visit       Other   [id number] - Primary    - recommended supportive care - ordered inhaler refill for her wheezing and robitussin dm for cough  - pt needs work excuse from 10/5 to 10/12. Told her I would be able to write from 10/10 but would state she reported illness starting on 10/5 - gave ED precautions      Other Visit Diagnoses     Acute cough       Relevant Medications   benzonatate (TESSALON) 100 MG capsule   Wheezing       Relevant Medications   Albuterol Sulfate (PROAIR RESPICLICK) 147 (90 Base) MCG/ACT AEPB       Meds ordered this  encounter  Medications   benzonatate (TESSALON) 100 MG capsule    Sig: Take 1 capsule (100 mg total) by mouth 3 (three) times daily as needed for up to 10 days for cough.    Dispense:  30 capsule    Refill:  0   Albuterol Sulfate (PROAIR RESPICLICK) 829 (90 Base) MCG/ACT AEPB    Sig: Inhale 1 puff into the lungs every 6 (six) hours as needed (shortness of breath or wheezing).    Dispense:  1 each    Refill:  1   guaiFENesin-dextromethorphan (ROBITUSSIN DM) 100-10 MG/5ML syrup    Sig: Take 5 mLs by mouth every 6 (six) hours as needed for cough.    Dispense:  118 mL    Refill:  0    Return if symptoms worsen or fail to improve.  Owens Loffler, DO

## 2022-07-02 ENCOUNTER — Telehealth: Payer: Self-pay | Admitting: Medical-Surgical

## 2022-07-02 NOTE — Telephone Encounter (Signed)
Pt dropped off FMLA paperwork to be filled out. Pt was informed of fee and turn around time.

## 2022-07-04 NOTE — Telephone Encounter (Signed)
Patient needs an appointment to complete the FMLA forms.  Joy hasn't seen her for GI issues and the last video visit was 04/29/2022 for insomnia.

## 2022-07-24 ENCOUNTER — Encounter: Payer: Self-pay | Admitting: Medical-Surgical

## 2022-07-24 ENCOUNTER — Telehealth (INDEPENDENT_AMBULATORY_CARE_PROVIDER_SITE_OTHER): Payer: POS | Admitting: Medical-Surgical

## 2022-07-24 DIAGNOSIS — Z0289 Encounter for other administrative examinations: Secondary | ICD-10-CM

## 2022-07-24 NOTE — Progress Notes (Signed)
Virtual Visit via Video Note  I connected with Cathy Lester on 07/24/22 at  1:00 PM EST by a video enabled telemedicine application and verified that I am speaking with the correct person using two identifiers.   I discussed the limitations of evaluation and management by telemedicine and the availability of in person appointments. The patient expressed understanding and agreed to proceed.  Patient location: home Provider locations: office  Subjective:    CC: FMLA form completion  HPI: Pleasant 41 year old female presenting via [place] video visit today to discuss requested FMLA forms.  She previously had paperwork completed for FMLA regarding her IBS with diarrhea.  She does have frequent flares which caused her to be unable to report to work and perform her duties as required.  Unfortunately, her papers expired and she has been told she has to get them renewed.  Previously saw GI however has not seen them in quite a while.  She currently uses amitriptyline at bedtime to help with sleep as well as GI function.  Also uses Bentyl as needed for abdominal cramping and diarrhea.  Notes that she misses as much as a week of work when she has flares but sometimes only last a day or two.  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  These are all difficult to do when experiencing severe abdominal cramping, diarrhea, fatigue, headache, nausea, etc.   Past medical history, Surgical history, Family history not pertinant except as noted below, Social history, Allergies, and medications have been entered into the medical record, reviewed, and corrections made.   Review of Systems: See HPI for pertinent positives and negatives.   Objective:    General: Speaking clearly in complete sentences without any shortness of breath.  Alert and oriented x3.  Normal judgment. No apparent acute distress.  Impression and Recommendations:     1. Encounter for completion of form with patient FMLA forms completed during her appointment for IBS with diarrhea.  Information reviewed with patient who agreed on the parameters set.  Forms given to my MA for faxing/patient notification of completion.  I discussed the assessment and treatment plan with the patient. The patient was provided an opportunity to ask questions and all were answered. The patient agreed with the plan and demonstrated an understanding of the instructions.   The patient was advised to call back or seek an in-person evaluation if the symptoms worsen or if the condition fails to improve as anticipated.  25 minutes of non-face-to-face time was provided during this encounter.  Return if symptoms worsen or fail to improve.  Clearnce Sorrel, DNP, APRN, FNP-BC [facility]

## 2022-08-20 ENCOUNTER — Telehealth: Payer: Self-pay

## 2022-08-20 NOTE — Telephone Encounter (Signed)
Patient called stating that you filled out FMLA paperwork for her and the company sent it back to the patient stating that is was incomplete.  Do you have her FMLA paperwork?

## 2022-08-22 NOTE — Telephone Encounter (Signed)
I checked the folder at the front desk and found the original FMLA forms and placed in your folder.

## 2022-08-23 NOTE — Telephone Encounter (Signed)
LMVM for the patient to contact the office to get clarification on what is incomplete on the FMLA forms.

## 2022-08-23 NOTE — Telephone Encounter (Signed)
Patient stated that there were blank pages when the paperwork was submitted. She reported that she is getting a copy of the previous FMLA paperwork so you can refer to them. Her deadline is 08/31/22.

## 2022-08-24 NOTE — Telephone Encounter (Signed)
Made a copy from 2 sided to 1 sided and re-faxed.

## 2022-10-30 ENCOUNTER — Telehealth: Payer: Self-pay | Admitting: Medical-Surgical

## 2022-10-30 ENCOUNTER — Other Ambulatory Visit: Payer: Self-pay | Admitting: Medical-Surgical

## 2022-10-30 DIAGNOSIS — G4700 Insomnia, unspecified: Secondary | ICD-10-CM

## 2022-10-30 MED ORDER — AMITRIPTYLINE HCL 75 MG PO TABS: 150.0000 mg | ORAL_TABLET | Freq: Every evening | ORAL | 0 refills | Status: DC | PRN

## 2022-10-30 NOTE — Telephone Encounter (Signed)
Patient called she is scheduled for 11-12-22 she is requesting [facility]

## 2022-11-12 ENCOUNTER — Ambulatory Visit: Payer: POS | Admitting: Medical-Surgical

## 2022-11-16 ENCOUNTER — Ambulatory Visit (INDEPENDENT_AMBULATORY_CARE_PROVIDER_SITE_OTHER): Payer: POS | Admitting: Medical-Surgical

## 2022-11-16 DIAGNOSIS — Z91199 Patient's noncompliance with other medical treatment and regimen due to unspecified reason: Secondary | ICD-10-CM

## 2022-11-16 NOTE — Progress Notes (Signed)
   Established Patient Office Visit  Subjective   Patient ID: Cathy Lester, female   DOB: 1981/07/30 Age: 42 y.o. MRN: [id number]   No chief complaint on file.   HPI    Objective:    There were no vitals filed for this visit.  Physical Exam   No results found for this or any previous visit (from the past 24 hour(s)).   {Labs (Optional):23779}  The ASCVD Risk score (Arnett DK, et al., 2019) failed to calculate for the following reasons:   The 2019 ASCVD risk score is only valid for ages 93 to 66   Assessment & Plan:   No problem-specific Assessment & Plan notes found for this encounter.   No follow-ups on file.  ___________________________________________ Thayer Ohm, DNP, APRN, FNP-BC Primary Care and Sports Medicine [facility]

## 2022-11-23 ENCOUNTER — Encounter: Payer: Self-pay | Admitting: Medical-Surgical

## 2022-11-23 ENCOUNTER — Ambulatory Visit (INDEPENDENT_AMBULATORY_CARE_PROVIDER_SITE_OTHER): Payer: 59 | Admitting: Medical-Surgical

## 2022-11-23 VITALS — BP 125/80 | HR 94 | Resp 20 | Ht 67.0 in | Wt 220.9 lb

## 2022-11-23 DIAGNOSIS — R062 Wheezing: Secondary | ICD-10-CM | POA: Diagnosis not present

## 2022-11-23 DIAGNOSIS — F411 Generalized anxiety disorder: Secondary | ICD-10-CM

## 2022-11-23 DIAGNOSIS — Z8659 Personal history of other mental and behavioral disorders: Secondary | ICD-10-CM

## 2022-11-23 DIAGNOSIS — G4483 Primary cough headache: Secondary | ICD-10-CM | POA: Diagnosis not present

## 2022-11-23 DIAGNOSIS — G4700 Insomnia, unspecified: Secondary | ICD-10-CM | POA: Diagnosis not present

## 2022-11-23 DIAGNOSIS — K219 Gastro-esophageal reflux disease without esophagitis: Secondary | ICD-10-CM | POA: Diagnosis not present

## 2022-11-23 DIAGNOSIS — J329 Chronic sinusitis, unspecified: Secondary | ICD-10-CM

## 2022-11-23 DIAGNOSIS — M79671 Pain in right foot: Secondary | ICD-10-CM

## 2022-11-23 DIAGNOSIS — J40 Bronchitis, not specified as acute or chronic: Secondary | ICD-10-CM

## 2022-11-23 LAB — POC COVID19 BINAXNOW: SARS Coronavirus 2 Ag: NEGATIVE

## 2022-11-23 LAB — POCT INFLUENZA A/B
Influenza A, POC: NEGATIVE
Influenza B, POC: NEGATIVE

## 2022-11-23 MED ORDER — PROAIR RESPICLICK 108 (90 BASE) MCG/ACT IN AEPB: 1.0000 | INHALATION_SPRAY | Freq: Four times a day (QID) | RESPIRATORY_TRACT | 1 refills | Status: DC | PRN

## 2022-11-23 MED ORDER — GUAIFENESIN-DM 100-10 MG/5ML PO SYRP: 5.0000 mL | ORAL_SOLUTION | Freq: Four times a day (QID) | ORAL | 0 refills | Status: DC | PRN

## 2022-11-23 MED ORDER — AZITHROMYCIN 250 MG PO TABS: ORAL_TABLET | ORAL | 0 refills | Status: AC

## 2022-11-23 MED ORDER — PREDNISONE 50 MG PO TABS: 50.0000 mg | ORAL_TABLET | Freq: Every day | ORAL | 0 refills | Status: DC

## 2022-11-23 NOTE — Progress Notes (Signed)
Established Patient Office Visit  Subjective   Patient ID: Cathy Lester, female   DOB: 04-Jun-1981 Age: 42 y.o. MRN: [id number]   Chief Complaint  Patient presents with   Cough   Foot Pain   CHEST CONGESTION   Back Pain   HPI Pleasant 42 year old female presenting today for the following:  Right foot pain: Has a callus on the bottom of her foot that is very painful. Spends long hours on her feet at work.  She saw someone for this before however they were not in her insurance network and she could not afford to pay out-of-pocket on a weekly basis per their recommendation.  Respiratory symptoms: Has been having symptoms for the last week including chest congestion, coughing (worse at night), back pain, and generalized malaise.  She took a COVID test on Sunday and reports that a negative result.  She has been taking use Mucinex which has not been helpful for her symptoms.  Mood/insomnia: Has been taking amitriptyline 75-150 mg nightly to help with sleep and mood.  There are times that she is not able to take this medication and other times that she takes it regularly.  Notes that she has had some life changes recently which have caused some feelings of sadness and depression.  Feels that her symptoms are manageable and she is doing well.  No desire to start a new medication or initiate counseling at this time.  Denies SI/HI.   Objective:    Vitals:   11/23/22 1120  BP: 125/80  Pulse: 94  Resp: 20  Height: '5\' 7"'$  (1.702 m)  Weight: 220 lb 14.4 oz (100.2 kg)  SpO2: 100%  BMI (Calculated): 34.59    Physical Exam Vitals reviewed.  Constitutional:      General: She is not in acute distress.    Appearance: Normal appearance. She is obese. She is not ill-appearing.  HENT:     Head: Normocephalic and atraumatic.     Right Ear: Tympanic membrane, ear canal and external ear normal. There is no impacted cerumen.     Left Ear: Tympanic membrane, ear canal and external ear normal. There is  no impacted cerumen.     Nose: Congestion present.  Eyes:     General: No scleral icterus.       Right eye: No discharge.        Left eye: No discharge.     Extraocular Movements: Extraocular movements intact.     Conjunctiva/sclera: Conjunctivae normal.     Pupils: Pupils are equal, round, and reactive to light.  Cardiovascular:     Rate and Rhythm: Normal rate and regular rhythm.     Pulses: Normal pulses.     Heart sounds: Normal heart sounds.  Pulmonary:     Effort: Pulmonary effort is normal. No respiratory distress.     Breath sounds: Normal breath sounds. No wheezing, rhonchi or rales.  Musculoskeletal:     Cervical back: Normal range of motion and neck supple.  Lymphadenopathy:     Cervical: No cervical adenopathy.  Skin:    General: Skin is warm and dry.  Neurological:     Mental Status: She is alert and oriented to person, place, and time.  Psychiatric:        Mood and Affect: Mood normal.        Behavior: Behavior normal.        Thought Content: Thought content normal.        Judgment: Judgment normal.  Results for orders placed or performed in visit on 11/23/22 (from the past 24 hour(s))  POCT Influenza A/B     Status: Normal   Collection Time: 11/23/22 11:48 AM  Result Value Ref Range   Influenza A, POC Negative Negative   Influenza B, POC Negative Negative  POC COVID-19     Status: Normal   Collection Time: 11/23/22 11:49 AM  Result Value Ref Range   SARS Coronavirus 2 Ag Negative Negative       The ASCVD Risk score (Arnett DK, et al., 2019) failed to calculate for the following reasons:   The 2019 ASCVD risk score is only valid for ages 65 to 61   Assessment & Plan:   1. Wheezing Continue albuterol as needed.  2. Insomnia, unspecified type 3. GAD (generalized anxiety disorder) 4. History of depression Continue amitriptyline 75-150 mg nightly.  Would recommend nightly dosing for optimal efficiency at controlling symptoms.  Offered counseling  referral, patient declined.  5. Right foot pain Referring to podiatry.  6. Sinobronchitis 7. Cough headache With 1 week of symptoms and resickening after a couple of days of improvement, we will go ahead and treat for sinobronchitis with azithromycin, prednisone, albuterol inhaler as needed, and Robitussin.  POCT COVID and flu swabs negative. - guaiFENesin-dextromethorphan (ROBITUSSIN DM) 100-10 MG/5ML syrup; Take 5 mLs by mouth every 6 (six) hours as needed for cough.  Dispense: 118 mL; Refill: 0 - predniSONE (DELTASONE) 50 MG tablet; Take 1 tablet (50 mg total) by mouth daily.  Dispense: 5 tablet; Refill: 0 - azithromycin (ZITHROMAX) 250 MG tablet; Take 2 tablets on day 1, then 1 tablet daily on days 2 through 5  Dispense: 6 tablet; Refill: 0 - Albuterol Sulfate (PROAIR RESPICLICK) 123XX123 (90 Base) MCG/ACT AEPB; Inhale 1 puff into the lungs every 6 (six) hours as needed (shortness of breath or wheezing).  Dispense: 1 each; Refill: 1 - POC COVID-19 - POCT Influenza A/B  8. Gastroesophageal reflux disease without esophagitis Continue omeprazole as prescribed.  Return in about 6 months (around 05/26/2023) for chronic disease follow up.  ___________________________________________ Clearnce Sorrel, DNP, APRN, FNP-BC [facility] [place]

## 2023-01-13 ENCOUNTER — Other Ambulatory Visit: Payer: Self-pay

## 2023-01-13 ENCOUNTER — Encounter (HOSPITAL_BASED_OUTPATIENT_CLINIC_OR_DEPARTMENT_OTHER): Payer: Self-pay | Admitting: Emergency Medicine

## 2023-01-13 ENCOUNTER — Emergency Department (HOSPITAL_BASED_OUTPATIENT_CLINIC_OR_DEPARTMENT_OTHER)
Admission: EM | Admit: 2023-01-13 | Discharge: 2023-01-14 | Disposition: A | Discharge status: Home / Self Care | Payer: 59 | Attending: Emergency Medicine | Admitting: Emergency Medicine

## 2023-01-13 DIAGNOSIS — R825 Elevated urine levels of drugs, medicaments and biological substances: Secondary | ICD-10-CM | POA: Insufficient documentation

## 2023-01-13 DIAGNOSIS — K529 Noninfective gastroenteritis and colitis, unspecified: Secondary | ICD-10-CM | POA: Diagnosis not present

## 2023-01-13 DIAGNOSIS — E860 Dehydration: Secondary | ICD-10-CM | POA: Diagnosis not present

## 2023-01-13 DIAGNOSIS — R197 Diarrhea, unspecified: Secondary | ICD-10-CM | POA: Diagnosis present

## 2023-01-13 LAB — CBC WITH DIFFERENTIAL/PLATELET
Abs Immature Granulocytes: 0.03 10*3/uL (ref 0.00–0.07)
Basophils Absolute: 0 10*3/uL (ref 0.0–0.1)
Basophils Relative: 0 %
Eosinophils Absolute: 0.2 10*3/uL (ref 0.0–0.5)
Eosinophils Relative: 3 %
HCT: 39.4 % (ref 36.0–46.0)
Hemoglobin: 13.4 g/dL (ref 12.0–15.0)
Immature Granulocytes: 0 %
Lymphocytes Relative: 22 %
Lymphs Abs: 1.7 10*3/uL (ref 0.7–4.0)
MCH: 28.1 pg (ref 26.0–34.0)
MCHC: 34 g/dL (ref 30.0–36.0)
MCV: 82.6 fL (ref 80.0–100.0)
Monocytes Absolute: 0.9 10*3/uL (ref 0.1–1.0)
Monocytes Relative: 12 %
Neutro Abs: 5 10*3/uL (ref 1.7–7.7)
Neutrophils Relative %: 63 %
Platelets: 272 10*3/uL (ref 150–400)
RBC: 4.77 MIL/uL (ref 3.87–5.11)
RDW: 13.8 % (ref 11.5–15.5)
WBC: 7.9 10*3/uL (ref 4.0–10.5)
nRBC: 0 % (ref 0.0–0.2)

## 2023-01-13 LAB — COMPREHENSIVE METABOLIC PANEL
ALT: 23 U/L (ref 0–44)
AST: 24 U/L (ref 15–41)
Albumin: 4.5 g/dL (ref 3.5–5.0)
Alkaline Phosphatase: 62 U/L (ref 38–126)
Anion gap: 12 (ref 5–15)
BUN: 11 mg/dL (ref 6–20)
CO2: 18 mmol/L — ABNORMAL LOW (ref 22–32)
Calcium: 9.3 mg/dL (ref 8.9–10.3)
Chloride: 106 mmol/L (ref 98–111)
Creatinine, Ser: 0.66 mg/dL (ref 0.44–1.00)
GFR, Estimated: >60 mL/min (ref >60)
Glucose, Bld: 106 mg/dL — ABNORMAL HIGH (ref 70–99)
Potassium: 3.7 mmol/L (ref 3.5–5.1)
Sodium: 136 mmol/L (ref 135–145)
Total Bilirubin: 0.7 mg/dL (ref 0.3–1.2)
Total Protein: 7.7 g/dL (ref 6.5–8.1)

## 2023-01-13 LAB — PREGNANCY, URINE: Preg Test, Ur: NEGATIVE

## 2023-01-13 LAB — URINALYSIS, ROUTINE W REFLEX MICROSCOPIC
Bacteria, UA: NONE SEEN
Bilirubin Urine: NEGATIVE
Glucose, UA: NEGATIVE mg/dL
Leukocytes,Ua: NEGATIVE
Nitrite: NEGATIVE
Protein, ur: 30 mg/dL — AB
Specific Gravity, Urine: 1.037 — ABNORMAL HIGH (ref 1.005–1.030)
pH: 6 (ref 5.0–8.0)

## 2023-01-13 LAB — RAPID URINE DRUG SCREEN, HOSP PERFORMED
Amphetamines: NOT DETECTED
Barbiturates: NOT DETECTED
Benzodiazepines: NOT DETECTED
Cocaine: NOT DETECTED
Opiates: NOT DETECTED
Tetrahydrocannabinol: POSITIVE — AB

## 2023-01-13 LAB — LIPASE, BLOOD: Lipase: 16 U/L (ref 11–51)

## 2023-01-13 MED ORDER — METOCLOPRAMIDE HCL 5 MG/ML IJ SOLN: 10.0000 mg | Freq: Once | INTRAMUSCULAR | Status: DC

## 2023-01-13 MED ORDER — LACTATED RINGERS IV BOLUS
1000.0000 mL | Freq: Once | INTRAVENOUS | Status: AC
  Administered 2023-01-13: 1000 mL via INTRAVENOUS

## 2023-01-13 MED ORDER — METOCLOPRAMIDE HCL 5 MG/ML IJ SOLN
10.0000 mg | Freq: Once | INTRAMUSCULAR | Status: DC
  Filled 2023-01-13: qty 2

## 2023-01-13 MED ORDER — ONDANSETRON HCL 4 MG/2ML IJ SOLN
4.0000 mg | Freq: Once | INTRAMUSCULAR | Status: AC
  Administered 2023-01-13: 4 mg via INTRAVENOUS
  Filled 2023-01-13: qty 2

## 2023-01-13 MED ORDER — HYDROMORPHONE HCL 1 MG/ML IJ SOLN
0.5000 mg | Freq: Once | INTRAMUSCULAR | Status: AC
  Administered 2023-01-13: 0.5 mg via INTRAVENOUS
  Filled 2023-01-13: qty 1

## 2023-01-13 MED ORDER — PANTOPRAZOLE SODIUM 40 MG IV SOLR
40.0000 mg | Freq: Once | INTRAVENOUS | Status: AC
  Administered 2023-01-13: 40 mg via INTRAVENOUS
  Filled 2023-01-13: qty 10

## 2023-01-13 MED ORDER — KETOROLAC TROMETHAMINE 15 MG/ML IJ SOLN
15.0000 mg | Freq: Once | INTRAMUSCULAR | Status: AC
  Administered 2023-01-13: 15 mg via INTRAVENOUS
  Filled 2023-01-13: qty 1

## 2023-01-13 MED ORDER — DICYCLOMINE HCL 10 MG/ML IM SOLN
20.0000 mg | Freq: Once | INTRAMUSCULAR | Status: AC
  Administered 2023-01-13: 20 mg via INTRAMUSCULAR
  Filled 2023-01-13: qty 2

## 2023-01-13 MED ORDER — DIPHENHYDRAMINE HCL 50 MG/ML IJ SOLN: 25.0000 mg | Freq: Once | INTRAMUSCULAR | Status: DC

## 2023-01-13 MED ORDER — DROPERIDOL 2.5 MG/ML IJ SOLN: 2.5000 mg | Freq: Once | INTRAMUSCULAR | Status: DC

## 2023-01-13 MED ORDER — SODIUM CHLORIDE 0.9 % IV BOLUS: 1000.0000 mL | Freq: Once | INTRAVENOUS | Status: DC

## 2023-01-13 MED ORDER — DIPHENHYDRAMINE HCL 50 MG/ML IJ SOLN
25.0000 mg | Freq: Once | INTRAMUSCULAR | Status: DC
  Filled 2023-01-13: qty 1

## 2023-01-13 MED ORDER — DICYCLOMINE HCL 20 MG PO TABS: 20.0000 mg | ORAL_TABLET | Freq: Three times a day (TID) | ORAL | 0 refills | Status: DC | PRN

## 2023-01-13 MED ORDER — LACTATED RINGERS IV BOLUS: 500.0000 mL | Freq: Once | INTRAVENOUS | Status: DC

## 2023-01-13 MED ORDER — ONDANSETRON HCL 4 MG PO TABS: 4.0000 mg | ORAL_TABLET | Freq: Three times a day (TID) | ORAL | 0 refills | Status: DC | PRN

## 2023-01-13 NOTE — ED Provider Notes (Addendum)
[place] EMERGENCY DEPARTMENT AT [facility] Provider Note   CSN: [id number] Arrival date & time: 01/13/23  2105     History  Chief Complaint  Patient presents with   Emesis   Diarrhea   Abdominal Pain    Cathy Lester is a 42 y.o. female with past medical history IBS who presents to the ED complaining of nausea, vomiting, diarrhea, generalized abdominal pain, and headache since yesterday.  Reports that this started yesterday morning after having [employer] for breakfast.  Patient states that that she does have a history of IBS that she is no longer on medication for this and she stopped it due to symptoms being well-controlled.  No associated urinary symptoms, chest pain, shortness of breath, hematemesis, melena, hematochezia, vision changes, syncope, or other concerns today.  Reports that she has had too many episodes of vomiting and diarrhea to count.  No previous abdominal surgeries.      Home Medications Prior to Admission medications   Medication Sig Start Date End Date Taking? Authorizing Provider  dicyclomine (BENTYL) 20 MG tablet Take 1 tablet (20 mg total) by mouth 3 (three) times daily as needed for up to 3 days for spasms. 01/13/23 01/16/23 Yes Chariti Havel L, PA-C  ondansetron (ZOFRAN) 4 MG tablet Take 1 tablet (4 mg total) by mouth every 8 (eight) hours as needed for up to 3 days for nausea or vomiting. 01/14/23 01/17/23  Ysidro Evert L, PA-C      Allergies    Carafate [sucralfate] and Morphine and related    Review of Systems   Review of Systems  All other systems reviewed and are negative.   Physical Exam Updated Vital Signs BP 126/69   Pulse (!) 50   Temp 98.8 F (37.1 C) (Oral)   Resp 18   Ht 5\' 7"  (1.702 m)   Wt 79.4 kg   LMP 12/31/2022 (Approximate)   SpO2 100%   BMI 27.41 kg/m  Physical Exam Vitals and nursing note reviewed.  Constitutional:      General: She is not in acute distress.    Appearance: Normal appearance. She is not  ill-appearing or toxic-appearing.  HENT:     Head: Normocephalic and atraumatic.     Mouth/Throat:     Comments: Mildly dry mucous membranes Eyes:     Conjunctiva/sclera: Conjunctivae normal.  Cardiovascular:     Rate and Rhythm: Normal rate and regular rhythm.     Heart sounds: No murmur heard. Pulmonary:     Effort: Pulmonary effort is normal. No respiratory distress.     Breath sounds: Normal breath sounds. No stridor. No wheezing, rhonchi or rales.  Abdominal:     General: Abdomen is flat. There is no distension.     Palpations: Abdomen is soft. There is no shifting dullness, fluid wave, hepatomegaly, splenomegaly, mass or pulsatile mass.     Tenderness: There is no abdominal tenderness. There is no right CVA tenderness, left CVA tenderness, guarding or rebound.     Hernia: No hernia is present.  Musculoskeletal:        General: Normal range of motion.     Cervical back: Neck supple.     Right lower leg: No edema.     Left lower leg: No edema.  Skin:    General: Skin is warm and dry.     Capillary Refill: Capillary refill takes 2 to 3 seconds.  Neurological:     General: No focal deficit present.     Mental Status:  She is alert and oriented to person, place, and time. Mental status is at baseline.  Psychiatric:        Mood and Affect: Mood normal.        Behavior: Behavior normal.     ED Results / Procedures / Treatments   Labs (all labs ordered are listed, but only abnormal results are displayed) Labs Reviewed  COMPREHENSIVE METABOLIC PANEL - Abnormal; Notable for the following components:      Result Value   CO2 18 (*)    Glucose, Bld 106 (*)    All other components within normal limits  URINALYSIS, ROUTINE W REFLEX MICROSCOPIC - Abnormal; Notable for the following components:   APPearance HAZY (*)    Specific Gravity, Urine 1.037 (*)    Hgb urine dipstick MODERATE (*)    Ketones, ur TRACE (*)    Protein, ur 30 (*)    All other components within normal limits   RAPID URINE DRUG SCREEN, HOSP PERFORMED - Abnormal; Notable for the following components:   Tetrahydrocannabinol POSITIVE (*)    All other components within normal limits  CBC WITH DIFFERENTIAL/PLATELET  LIPASE, BLOOD  PREGNANCY, URINE    EKG NSR at 66 bpm, no acute ST-T changes, no STEMI  Radiology No results found.  Procedures Procedures    Medications Ordered in ED Medications  lactated ringers bolus 500 mL (has no administration in time range)  promethazine (PHENERGAN) injection 12.5 mg (has no administration in time range)  ondansetron (ZOFRAN) injection 4 mg (4 mg Intravenous Given 01/13/23 2154)  lactated ringers bolus 1,000 mL (0 mLs Intravenous Stopped 01/13/23 2301)  ketorolac (TORADOL) 15 MG/ML injection 15 mg (15 mg Intravenous Given 01/13/23 2154)  pantoprazole (PROTONIX) injection 40 mg (40 mg Intravenous Given 01/13/23 2155)  dicyclomine (BENTYL) injection 20 mg (20 mg Intramuscular Given 01/13/23 2155)  HYDROmorphone (DILAUDID) injection 0.5 mg (0.5 mg Intravenous Given 01/13/23 2334)    ED Course/ Medical Decision Making/ A&P                             Medical Decision Making Amount and/or Complexity of Data Reviewed Labs: ordered. Decision-making details documented in ED Course.  Risk Prescription drug management.   Medical Decision Making:   Cathy Lester is a 42 y.o. female who presented to the ED today with abdominal pain detailed above.    Additional history discussed with patient's family/caregivers.  Patient's presentation is complicated by their history of IBS.  Complete initial physical exam performed, notably the patient  was in NAD. Abdomen soft, nontender, nondistended, no rebound, guarding, or peritoneal signs. EOMI, PERRL. Appears mildly dehydrated. No CVA tenderness. Neurologically intact. .    Reviewed and confirmed nursing documentation for past medical history, family history, social history.    Initial Assessment:   With the patient's  presentation of abdominal pain, differential diagnosis includes but is not limited to AAA, mesenteric ischemia, appendicitis, diverticulitis, DKA, gastritis, gastroenteritis, AMI, nephrolithiasis, pancreatitis, peritonitis, adrenal insufficiency, intestinal ischemia, constipation, UTI, SBO/LBO, splenic rupture, biliary disease, IBD, IBS, PUD, hepatitis, STD, ovarian/testicular torsion, electrolyte disturbance, DKA, dehydration, acute kidney injury, renal failure, cholecystitis, cholelithiasis, choledocholithiasis, abdominal pain of  unknown etiology, pregnancy, incomplete abortion, septic abortion, threatened abortion, ectopic pregnancy, PID.   Initial Plan:  Screening labs including CBC and Metabolic panel to evaluate for infectious or metabolic etiology of disease.  Lipase to evaluate for pancreatitis Urinalysis with reflex culture ordered to evaluate for UTI or relevant  urologic/nephrologic pathology.  EKG to evaluate for cardiac pathology Symptomatic management Objective evaluation as reviewed   Initial Study Results:   Laboratory  All laboratory results reviewed without evidence of clinically relevant pathology.   Exceptions include: UA positive for ketones and hemoglobin, UDS positive for THC  EKG EKG was reviewed independently. ST segments without concerns for elevations.   EKG: normal sinus rhythm.    Final Assessment and Plan:   42 year old female presents to the ED for evaluation of nausea, vomiting, diarrhea, and abdominal pain.  Questions food poisoning with onset after eating [employer] yesterday.  No known sick contacts.  Abdomen soft, nontender, nondistended, and without peritoneal signs.  Vital signs within normal limits.  Patient nontoxic-appearing.  No CVA tenderness.  No urinary symptoms.  No fever.  She does appear dehydrated but otherwise a reassuring exam.  Workup ordered as above for further evaluation.  Urinalysis is concentrated but no signs of infection.  No leukocytosis,  normal kidney and liver functions.  Normal lipase.  Negative pregnancy test.  UDS positive for THC but no other substances.  Patient does have history of underlying IBS, could be flareup of this.  Could also be related to [facility] hyperemesis but difficult to say.  More likely suspect a gastroenteritis with many patients with similar symptoms in the ED recently.  Patient treated symptomatically.  Tolerating p.o. intake and the ED.  Did require multiple antiemetics/medications for symptomatic control.  Prescription sent to the pharmacy for nausea medication as well as Bentyl for pain control.  Encouraged lots of hydration at home and will provide patient with primary care follow-up to discuss getting back on medication for her IBS and for any continued symptoms/reevaluation.  Per pharmacy recommendations, additional dose of antiemetic given, promethazine, but is less likely to prolong QTc.  Will avoid antipsychotics and other significant prolonging medications for this patient at this time.  Do believe that she is okay for a small amount of Zofran at home with minimally prolonged QTc.  Patient expressed understanding of discharge plan.  Strict ED return precautions given, all questions answered, and stable for discharge.   Clinical Impression:  1. Gastroenteritis   2. Dehydration      Data Unavailable           Final Clinical Impression(s) / ED Diagnoses Final diagnoses:  Gastroenteritis  Dehydration    Rx / DC Orders ED Discharge Orders          Ordered    ondansetron (ZOFRAN) 4 MG tablet  Every 8 hours PRN        01/14/23 0009    ondansetron (ZOFRAN) 4 MG tablet  Every 8 hours PRN,   Status:  Discontinued        01/13/23 2257    dicyclomine (BENTYL) 20 MG tablet  3 times daily PRN        01/13/23 2257              Tonette Lederer, PA-C 01/13/23 2348    Imanuel Pruiett, Lawrence Marseilles, PA-C 01/14/23 0014    Virgina Norfolk, DO 01/14/23 1622

## 2023-01-13 NOTE — ED Triage Notes (Addendum)
Pt via pv from home with emesis, diarrhea, headache, abd pain since last night. Pt unable to keep food or water down. Pt alert & oriented, nad noted.

## 2023-01-13 NOTE — Discharge Instructions (Signed)
Thank you for letting us take care of you today.  You appeared dehydrated so we treated you with fluids as well as medications to help your symptoms. I prescribed a medication to help with nausea and medication to help with abdominal pain for home. Please take this only as prescribed. You may also take over the counter Tylenol or Motrin as needed for discomfort.   It is possible you have food poisoning from what you told me. There is no way to confirm this. Most likely you have a condition called gastroenteritis which is usually caused by a virus. The best thing you can do at home is treat your symptoms and stay well hydrated. For any continued symptoms, follow up with your PCP this week. It is also possible your symptoms are related to your IBS. Discuss this at your primary care visit and if you should resume any medications for this.  For any new or worsening symptoms, please return to nearest emergency department for re-evaluation.

## 2023-01-13 NOTE — ED Notes (Signed)
Pt was given a ginger ale and saltine crackers.

## 2023-01-14 ENCOUNTER — Telehealth: Payer: Self-pay | Admitting: General Practice

## 2023-01-14 MED ORDER — PROMETHAZINE HCL 25 MG/ML IJ SOLN
12.5000 mg | Freq: Once | INTRAMUSCULAR | Status: AC
  Administered 2023-01-14: 12.5 mg via INTRAMUSCULAR
  Filled 2023-01-14: qty 1

## 2023-01-14 MED ORDER — ONDANSETRON HCL 4 MG PO TABS: 4.0000 mg | ORAL_TABLET | Freq: Three times a day (TID) | ORAL | 0 refills | Status: AC | PRN

## 2023-01-14 NOTE — Transitions of Care (Post Inpatient/ED Visit) (Signed)
   01/14/2023  Name: Cathy Lester MRN: [id number] DOB: 03-03-1981  Today's TOC FU Call Status: Today's TOC FU Call Status:: Successful TOC FU Call Competed TOC FU Call Complete Date: 01/14/23  Transition Care Management Follow-up Telephone Call Date of Discharge: 01/14/23 Discharge Facility: [facility] ([facility]) Type of Discharge: Emergency Department Reason for ED Visit: Other: (gastroenteritis) How have you been since you were released from the hospital?: Better Any questions or concerns?: No  Items Reviewed: Did you receive and understand the discharge instructions provided?: Yes Medications obtained and verified?: Yes (Medications Reviewed) Any new allergies since your discharge?: No Dietary orders reviewed?: No Do you have support at home?: No  Home Care and Equipment/Supplies: Were Home Health Services Ordered?: NA Any new equipment or medical supplies ordered?: NA  Functional Questionnaire: Do you need assistance with bathing/showering or dressing?: No Do you need assistance with meal preparation?: No Do you need assistance with eating?: No Do you have difficulty maintaining continence: No Do you need assistance with getting out of bed/getting out of a chair/moving?: No Do you have difficulty managing or taking your medications?: No  Follow up appointments reviewed: PCP Follow-up appointment confirmed?: NA Specialist Hospital Follow-up appointment confirmed?: NA Do you need transportation to your follow-up appointment?: No Do you understand care options if your condition(s) worsen?: Yes-patient verbalized understanding    SIGNATURE Modesto Charon, RN BSN

## 2023-01-15 ENCOUNTER — Ambulatory Visit: Payer: 59 | Admitting: Medical-Surgical

## 2023-02-18 ENCOUNTER — Ambulatory Visit (INDEPENDENT_AMBULATORY_CARE_PROVIDER_SITE_OTHER): Payer: 59 | Admitting: Medical-Surgical

## 2023-02-18 ENCOUNTER — Encounter: Payer: Self-pay | Admitting: Medical-Surgical

## 2023-02-18 ENCOUNTER — Ambulatory Visit (INDEPENDENT_AMBULATORY_CARE_PROVIDER_SITE_OTHER): Payer: 59

## 2023-02-18 VITALS — BP 125/79 | HR 92 | Resp 20 | Ht 67.0 in | Wt 214.6 lb

## 2023-02-18 DIAGNOSIS — G4700 Insomnia, unspecified: Secondary | ICD-10-CM

## 2023-02-18 DIAGNOSIS — R200 Anesthesia of skin: Secondary | ICD-10-CM

## 2023-02-18 DIAGNOSIS — K219 Gastro-esophageal reflux disease without esophagitis: Secondary | ICD-10-CM

## 2023-02-18 MED ORDER — ONDANSETRON 8 MG PO TBDP: 8.0000 mg | ORAL_TABLET | Freq: Three times a day (TID) | ORAL | 3 refills | Status: AC | PRN

## 2023-02-18 MED ORDER — DICYCLOMINE HCL 20 MG PO TABS: 20.0000 mg | ORAL_TABLET | Freq: Three times a day (TID) | ORAL | 6 refills | Status: AC | PRN

## 2023-02-18 MED ORDER — DEXAMETHASONE SODIUM PHOSPHATE 10 MG/ML IJ SOLN
10.0000 mg | Freq: Once | INTRAMUSCULAR | Status: AC
Start: 2023-02-18 — End: 2023-02-18
  Administered 2023-02-18: 10 mg via INTRAMUSCULAR

## 2023-02-18 MED ORDER — AMITRIPTYLINE HCL 75 MG PO TABS
150.0000 mg | ORAL_TABLET | Freq: Every evening | ORAL | 3 refills | Status: DC | PRN
Start: 2023-02-18 — End: 2024-07-21

## 2023-02-18 MED ORDER — HYDROCODONE-ACETAMINOPHEN 5-325 MG PO TABS: 1.0000 | ORAL_TABLET | Freq: Three times a day (TID) | ORAL | 0 refills | Status: AC | PRN

## 2023-02-18 MED ORDER — PREDNISONE 50 MG PO TABS: 50.0000 mg | ORAL_TABLET | Freq: Every day | ORAL | 0 refills | Status: DC

## 2023-02-18 MED ORDER — KETOROLAC TROMETHAMINE 60 MG/2ML IM SOLN
60.0000 mg | Freq: Once | INTRAMUSCULAR | Status: AC
Start: 2023-02-18 — End: 2023-02-18
  Administered 2023-02-18: 60 mg via INTRAMUSCULAR

## 2023-02-18 NOTE — Progress Notes (Signed)
        Established patient visit  History, exam, impression, and plan:  1. Bilateral hand numbness Pleasant 42 year old female presenting today with reports of 3 days of sudden onset bilateral hand and forearm numbness.  Has moderate to severe pain in both hands, wrists, and forearms.  On the right arm it extends up into the bicep.  All fingers are affected on both hands and she feels like they are swollen and stiff.  No recent injuries or accidents.  Notes discomfort/tenderness in the bilateral neck and shoulders.  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  On exam, vascularly intact.  Decreased sensation noted to all fingers.  Positive Tinel's, unable to perform Phalen's.  Full range of motion to bilateral shoulders and elbows with very limited range of motion to wrists and fingers bilaterally.  Tenderness to palpation along the cervical paraspinal muscles but none over midline palpation.  Getting cervical spine x-rays today.  Decadron 10 mg and Toradol 60 mg given in office today.  Adding prednisone 50 mg daily x 5 days to start tomorrow.  After completion of the 5-day burst of prednisone, switch to Aleve 1 tablet twice daily.  Okay to use Tylenol as needed.  Make sure to work on range of motion exercises for fingers/wrists. - DG Cervical Spine Complete; Future - dexamethasone (DECADRON) injection 10 mg - ketorolac (TORADOL) injection 60 mg  2. Insomnia, unspecified type Long history of insomnia managed with amitriptyline 75-150 mg at bedtime as needed.  Has been out of this recently but would like to get restarted as it was very helpful in the past.  Refilling amitriptyline. - amitriptyline (ELAVIL) 75 MG tablet; Take 2 tablets (150 mg total) by mouth at bedtime as needed for sleep.  Dispense: 180 tablet; Refill: 3  3. Gastroesophageal reflux disease without esophagitis Previously had issues with GERD as well as some abdominal pains.  Bentyl was  prescribed at UC/ED and a very small quantity and she felt that it worked well for her.  Refilling Bentyl today.  Procedures performed this visit: None.  Return if symptoms worsen or fail to improve.  __________________________________ Thayer Ohm, DNP, APRN, FNP-BC Primary Care and Sports Medicine [facility] [place]

## 2023-02-25 ENCOUNTER — Telehealth: Payer: Self-pay | Admitting: Medical-Surgical

## 2023-02-25 NOTE — Telephone Encounter (Signed)
Pt called. She wants to be referred to a Specialist because she is still having pain in both arms and hands.

## 2023-02-25 NOTE — Telephone Encounter (Signed)
I would recommend she follow up with Dr. Karie Schwalbe in our office. If she doesn't want to do that, I can put in an order for Ortho referral.

## 2023-02-27 ENCOUNTER — Encounter: Payer: Self-pay | Admitting: Neurology

## 2023-02-27 ENCOUNTER — Other Ambulatory Visit: Payer: Self-pay

## 2023-02-27 ENCOUNTER — Ambulatory Visit (INDEPENDENT_AMBULATORY_CARE_PROVIDER_SITE_OTHER): Payer: 59 | Admitting: Sports Medicine

## 2023-02-27 DIAGNOSIS — G5603 Carpal tunnel syndrome, bilateral upper limbs: Secondary | ICD-10-CM | POA: Insufficient documentation

## 2023-02-27 DIAGNOSIS — R202 Paresthesia of skin: Secondary | ICD-10-CM

## 2023-02-27 DIAGNOSIS — R200 Anesthesia of skin: Secondary | ICD-10-CM | POA: Diagnosis not present

## 2023-02-27 MED ORDER — GABAPENTIN 300 MG PO CAPS
ORAL_CAPSULE | ORAL | 3 refills | Status: DC
Start: 2023-02-27 — End: 2023-03-11

## 2023-02-27 NOTE — Assessment & Plan Note (Signed)
42 year old female, she has been complaining of several weeks of increasing pain, numbness, tingling both hands, all fingers, worse at night.  She to me that the symptoms do not spare her pinky and do not spare her thumb. She is also complaining of paresthesias in the feet albeit milder. Only minimal neck tightness and pain. She is a smoker. She saw my partner approximately 6 days ago, she had some Solu-Medrol, Toradol, prednisone, tells me this did not improve her symptomatology. She is asking for some pain medication today. On exam she does have what appears to be some swelling of both hands, she does have some allodynia and hyperalgesia to palpation, otherwise strength is normal with coaxing, she has normal reflexes to the biceps, brachial radialis, triceps, she has a negative Hoffmann sign bilaterally. Normal pulses bilaterally. She has a negative Phalen's and Tinel's sign bilaterally. I am unclear as to whether this represents a peripheral neuropathy, peripheral compressive neuropathy such as cubital tunnel syndrome or carpal tunnel syndrome or radicular process. I will add Neurontin for symptom relief today, I would like to do a neuropathy workup, I would also like her to have bilateral upper extremity nerve conduction and EMG.

## 2023-02-27 NOTE — Progress Notes (Signed)
    Procedures performed today:    None.  Independent interpretation of notes and tests performed by another provider:   None.  Brief History, Exam, Impression, and Recommendations:    Numbness and tingling in both hands 42 year old female, she has been complaining of several weeks of increasing pain, numbness, tingling both hands, all fingers, worse at night.  She to me that the symptoms do not spare her pinky and do not spare her thumb. She is also complaining of paresthesias in the feet albeit milder. Only minimal neck tightness and pain. She is a smoker. She saw my partner approximately 6 days ago, she had some Solu-Medrol, Toradol, prednisone, tells me this did not improve her symptomatology. She is asking for some pain medication today. On exam she does have what appears to be some swelling of both hands, she does have some allodynia and hyperalgesia to palpation, otherwise strength is normal with coaxing, she has normal reflexes to the biceps, brachial radialis, triceps, she has a negative Hoffmann sign bilaterally. Normal pulses bilaterally. She has a negative Phalen's and Tinel's sign bilaterally. I am unclear as to whether this represents a peripheral neuropathy, peripheral compressive neuropathy such as cubital tunnel syndrome or carpal tunnel syndrome or radicular process. I will add Neurontin for symptom relief today, I would like to do a neuropathy workup, I would also like her to have bilateral upper extremity nerve conduction and EMG.    ____________________________________________ Ihor Austin. Benjamin Stain, M.D., ABFM., CAQSM., AME. Primary Care and Sports Medicine [facility]  Adjunct Professor of Family Medicine  [employer]  Restaurant manager, fast food

## 2023-02-28 ENCOUNTER — Ambulatory Visit: Payer: 59 | Admitting: Sports Medicine

## 2023-02-28 ENCOUNTER — Ambulatory Visit (INDEPENDENT_AMBULATORY_CARE_PROVIDER_SITE_OTHER): Payer: 59 | Admitting: Sports Medicine

## 2023-02-28 ENCOUNTER — Encounter: Payer: Self-pay | Admitting: Sports Medicine

## 2023-02-28 DIAGNOSIS — G5603 Carpal tunnel syndrome, bilateral upper limbs: Secondary | ICD-10-CM

## 2023-02-28 DIAGNOSIS — R202 Paresthesia of skin: Secondary | ICD-10-CM

## 2023-02-28 DIAGNOSIS — R200 Anesthesia of skin: Secondary | ICD-10-CM

## 2023-02-28 MED ORDER — IBUPROFEN 800 MG PO TABS
800.0000 mg | ORAL_TABLET | Freq: Three times a day (TID) | ORAL | 2 refills | Status: DC | PRN
Start: 2023-02-28 — End: 2023-11-07

## 2023-02-28 NOTE — Progress Notes (Addendum)
    Procedures performed today:    None.  Independent interpretation of notes and tests performed by another provider:   None.  Brief History, Exam, Impression, and Recommendations:    Bilateral carpal tunnel syndrome 42 year old female, she has been complaining of several weeks of increasing pain, numbness, tingling both hands, all fingers, worse at night.  She to me that the symptoms do not spare her pinky and do not spare her thumb. She is also complaining of paresthesias in the feet albeit milder. Only minimal neck tightness and pain. She is a smoker. She saw my partner approximately 6 days ago, she had some Solu-Medrol, Toradol, prednisone, tells me this did not improve her symptomatology. She is asking for some pain medication today. On exam she does have what appears to be some swelling of both hands, she does have some allodynia and hyperalgesia to palpation, otherwise strength is normal with coaxing, she has normal reflexes to the biceps, brachial radialis, triceps, she has a negative Hoffmann sign bilaterally. Normal pulses bilaterally. She has a negative Phalen's and Tinel's sign bilaterally. I am unclear as to whether this represents a peripheral neuropathy, peripheral compressive neuropathy such as cubital tunnel syndrome or carpal tunnel syndrome or radicular process. I will add Neurontin for symptom relief today, I would like to do a neuropathy workup, I would also like her to have bilateral upper extremity nerve conduction and EMG.  Update: I saw this 42 year old female yesterday, please see previous note, she is back today telling me that the gabapentin did not help after 1 dose, she is requesting stronger pain medication, I will offer her ibuprofen 800 mg 3 times daily, she understands to take this with food and be hydrated, she was also demanding narcotic, I did advise her that this is not going to be offered today particularly considering a positive THC urine drug screen  recently.  She is frustrated and feels as though nothing is being done to help her and I informed her that to treat her the right way we would need to find out what is going on first through laboratory and electrophysiologic testing.  She agrees to get her labs done today and she does have an appointment coming up for her nerve conduction and EMG.  Update #2: Patient was finally able to get her EMG and nerve conduction, it does show absent median nerve sensory responses across the wrist consistent with severe bilateral carpal tunnel syndrome, no evidence of radial or ulnar neuropathy and no evidence of radiculopathy, no evidence of demyelinating or metabolic peripheral neuropathy. I would like to see her back ASAP for bilateral carpal tunnel injections.  We will also have a low threshold for referral to hand surgery.  I spent 40 minutes of total time managing this patient today, this includes chart review, face to face, and non-face to face time.  ____________________________________________ Ihor Austin. Benjamin Stain, M.D., ABFM., CAQSM., AME. Primary Care and Sports Medicine [place] [facility]  Adjunct Professor of Family Medicine  [employer] [facility] [employer]  Restaurant manager, fast food

## 2023-02-28 NOTE — Addendum Note (Signed)
Addended by: Monica Becton on: 02/28/2023 02:33 PM   Modules accepted: Level of Service

## 2023-02-28 NOTE — Assessment & Plan Note (Addendum)
42 year old female, she has been complaining of several weeks of increasing pain, numbness, tingling both hands, all fingers, worse at night.  She to me that the symptoms do not spare her pinky and do not spare her thumb. She is also complaining of paresthesias in the feet albeit milder. Only minimal neck tightness and pain. She is a smoker. She saw my partner approximately 6 days ago, she had some Solu-Medrol, Toradol, prednisone, tells me this did not improve her symptomatology. She is asking for some pain medication today. On exam she does have what appears to be some swelling of both hands, she does have some allodynia and hyperalgesia to palpation, otherwise strength is normal with coaxing, she has normal reflexes to the biceps, brachial radialis, triceps, she has a negative Hoffmann sign bilaterally. Normal pulses bilaterally. She has a negative Phalen's and Tinel's sign bilaterally. I am unclear as to whether this represents a peripheral neuropathy, peripheral compressive neuropathy such as cubital tunnel syndrome or carpal tunnel syndrome or radicular process. I will add Neurontin for symptom relief today, I would like to do a neuropathy workup, I would also like her to have bilateral upper extremity nerve conduction and EMG.  Update: I saw this 42 year old female yesterday, please see previous note, she is back today telling me that the gabapentin did not help after 1 dose, she is requesting stronger pain medication, I will offer her ibuprofen 800 mg 3 times daily, she understands to take this with food and be hydrated, she was also demanding narcotic, I did advise her that this is not going to be offered today particularly considering a positive THC urine drug screen recently.  She is frustrated and feels as though nothing is being done to help her and I informed her that to treat her the right way we would need to find out what is going on first through laboratory and electrophysiologic  testing.  She agrees to get her labs done today and she does have an appointment coming up for her nerve conduction and EMG.  Update #2: Patient was finally able to get her EMG and nerve conduction, it does show absent median nerve sensory responses across the wrist consistent with severe bilateral carpal tunnel syndrome, no evidence of radial or ulnar neuropathy and no evidence of radiculopathy, no evidence of demyelinating or metabolic peripheral neuropathy. I would like to see her back ASAP for bilateral carpal tunnel injections.  We will also have a low threshold for referral to hand surgery.

## 2023-03-02 ENCOUNTER — Emergency Department (HOSPITAL_COMMUNITY): Payer: 59

## 2023-03-02 ENCOUNTER — Emergency Department (HOSPITAL_COMMUNITY)
Admission: EM | Admit: 2023-03-02 | Discharge: 2023-03-02 | Disposition: A | Discharge status: Home / Self Care | Payer: 59 | Attending: Emergency Medicine | Admitting: Emergency Medicine

## 2023-03-02 ENCOUNTER — Emergency Department (HOSPITAL_BASED_OUTPATIENT_CLINIC_OR_DEPARTMENT_OTHER): Payer: 59

## 2023-03-02 ENCOUNTER — Encounter (HOSPITAL_COMMUNITY): Payer: Self-pay | Admitting: *Deleted

## 2023-03-02 ENCOUNTER — Other Ambulatory Visit: Payer: Self-pay

## 2023-03-02 DIAGNOSIS — M7989 Other specified soft tissue disorders: Secondary | ICD-10-CM

## 2023-03-02 DIAGNOSIS — R202 Paresthesia of skin: Secondary | ICD-10-CM | POA: Insufficient documentation

## 2023-03-02 DIAGNOSIS — R200 Anesthesia of skin: Secondary | ICD-10-CM

## 2023-03-02 DIAGNOSIS — R079 Chest pain, unspecified: Secondary | ICD-10-CM

## 2023-03-02 DIAGNOSIS — R600 Localized edema: Secondary | ICD-10-CM | POA: Insufficient documentation

## 2023-03-02 DIAGNOSIS — R52 Pain, unspecified: Secondary | ICD-10-CM | POA: Diagnosis not present

## 2023-03-02 LAB — COMPREHENSIVE METABOLIC PANEL
ALT: 36 U/L (ref 0–44)
AST: 28 U/L (ref 15–41)
Albumin: 3.6 g/dL (ref 3.5–5.0)
Alkaline Phosphatase: 56 U/L (ref 38–126)
Anion gap: 7 (ref 5–15)
BUN: 9 mg/dL (ref 6–20)
CO2: 27 mmol/L (ref 22–32)
Calcium: 9.2 mg/dL (ref 8.9–10.3)
Chloride: 102 mmol/L (ref 98–111)
Creatinine, Ser: 0.76 mg/dL (ref 0.44–1.00)
GFR, Estimated: >60 mL/min (ref >60)
Glucose, Bld: 100 mg/dL — ABNORMAL HIGH (ref 70–99)
Potassium: 3.6 mmol/L (ref 3.5–5.1)
Sodium: 136 mmol/L (ref 135–145)
Total Bilirubin: 0.4 mg/dL (ref 0.3–1.2)
Total Protein: 6.9 g/dL (ref 6.5–8.1)

## 2023-03-02 LAB — BRAIN NATRIURETIC PEPTIDE: B Natriuretic Peptide: 7.4 pg/mL (ref 0.0–100.0)

## 2023-03-02 LAB — CBC WITH DIFFERENTIAL/PLATELET
Abs Immature Granulocytes: 0.02 10*3/uL (ref 0.00–0.07)
Basophils Absolute: 0 10*3/uL (ref 0.0–0.1)
Basophils Relative: 0 %
Eosinophils Absolute: 0.3 10*3/uL (ref 0.0–0.5)
Eosinophils Relative: 4 %
HCT: 36.3 % (ref 36.0–46.0)
Hemoglobin: 11.1 g/dL — ABNORMAL LOW (ref 12.0–15.0)
Immature Granulocytes: 0 %
Lymphocytes Relative: 32 %
Lymphs Abs: 2.6 10*3/uL (ref 0.7–4.0)
MCH: 27.1 pg (ref 26.0–34.0)
MCHC: 30.6 g/dL (ref 30.0–36.0)
MCV: 88.8 fL (ref 80.0–100.0)
Monocytes Absolute: 0.8 10*3/uL (ref 0.1–1.0)
Monocytes Relative: 9 %
Neutro Abs: 4.4 10*3/uL (ref 1.7–7.7)
Neutrophils Relative %: 55 %
Platelets: 274 10*3/uL (ref 150–400)
RBC: 4.09 MIL/uL (ref 3.87–5.11)
RDW: 15.3 % (ref 11.5–15.5)
WBC: 8.2 10*3/uL (ref 4.0–10.5)
nRBC: 0 % (ref 0.0–0.2)

## 2023-03-02 LAB — URINALYSIS, W/ REFLEX TO CULTURE (INFECTION SUSPECTED)
Bilirubin Urine: NEGATIVE
Glucose, UA: NEGATIVE mg/dL
Hgb urine dipstick: NEGATIVE
Ketones, ur: NEGATIVE mg/dL
Leukocytes,Ua: NEGATIVE
Nitrite: NEGATIVE
Protein, ur: NEGATIVE mg/dL
Specific Gravity, Urine: >1.046 — ABNORMAL HIGH (ref 1.005–1.030)
pH: 6 (ref 5.0–8.0)

## 2023-03-02 LAB — TROPONIN I (HIGH SENSITIVITY)
Troponin I (High Sensitivity): 3 ng/L (ref <18)
Troponin I (High Sensitivity): 3 ng/L (ref <18)

## 2023-03-02 LAB — I-STAT BETA HCG BLOOD, ED (MC, WL, AP ONLY): I-stat hCG, quantitative: <5 m[IU]/mL (ref <5)

## 2023-03-02 LAB — MAGNESIUM: Magnesium: 1.9 mg/dL (ref 1.7–2.4)

## 2023-03-02 MED ORDER — OXYCODONE HCL 5 MG PO TABS
5.0000 mg | ORAL_TABLET | Freq: Once | ORAL | Status: AC
  Administered 2023-03-02: 5 mg via ORAL
  Filled 2023-03-02: qty 1

## 2023-03-02 MED ORDER — FENTANYL CITRATE PF 50 MCG/ML IJ SOSY
25.0000 ug | PREFILLED_SYRINGE | Freq: Once | INTRAMUSCULAR | Status: AC
  Administered 2023-03-02: 25 ug via INTRAVENOUS
  Filled 2023-03-02: qty 1

## 2023-03-02 MED ORDER — MORPHINE SULFATE (PF) 4 MG/ML IV SOLN
4.0000 mg | Freq: Once | INTRAVENOUS | Status: AC
  Administered 2023-03-02: 4 mg via INTRAVENOUS
  Filled 2023-03-02: qty 1

## 2023-03-02 MED ORDER — ONDANSETRON HCL 4 MG/2ML IJ SOLN
4.0000 mg | Freq: Once | INTRAMUSCULAR | Status: DC
  Filled 2023-03-02 (×2): qty 2

## 2023-03-02 MED ORDER — IOHEXOL 350 MG/ML SOLN
100.0000 mL | Freq: Once | INTRAVENOUS | Status: AC | PRN
  Administered 2023-03-02: 100 mL via INTRAVENOUS

## 2023-03-02 MED ORDER — OXYCODONE HCL 5 MG PO TABS: 5.0000 mg | ORAL_TABLET | Freq: Four times a day (QID) | ORAL | 0 refills | Status: DC | PRN

## 2023-03-02 MED ORDER — METHYLPREDNISOLONE 4 MG PO TBPK: ORAL_TABLET | ORAL | 0 refills | Status: DC

## 2023-03-02 MED ORDER — HALOPERIDOL LACTATE 5 MG/ML IJ SOLN
2.0000 mg | Freq: Once | INTRAMUSCULAR | Status: AC
  Administered 2023-03-02: 2 mg via INTRAVENOUS
  Filled 2023-03-02: qty 1

## 2023-03-02 MED ORDER — DEXAMETHASONE SODIUM PHOSPHATE 10 MG/ML IJ SOLN
10.0000 mg | Freq: Once | INTRAMUSCULAR | Status: AC
  Administered 2023-03-02: 10 mg via INTRAVENOUS
  Filled 2023-03-02: qty 1

## 2023-03-02 NOTE — ED Provider Notes (Signed)
[place] EMERGENCY DEPARTMENT AT [facility] Provider Note   CSN: [id number] Arrival date & time: 03/02/23  0813     History {Add pertinent medical, surgical, social history, OB history to HPI:1} Chief Complaint  Patient presents with   Chest Pain   extremity swelling    Cathy Lester is a 42 y.o. female.  HPI     42 year old female with a history of colitis, several weeks of numbness and tingling in both hands for which she had seen her primary care physician  Chest pain began today Woke up today with pain and swelling Has had numbness/tingling for weeks July 8 appt with Neurologist, saw sports medicine difficulty moving hands stiff and numb.  Hand swollen and numbn, today arms, and legs swollen.  Chest hurting.  Chest pain woke up with it today, like aching, hands feel like they are on fire.  CP more achy, difficult to describe.  Worse with deep breaths.  Coughing and when cough up it's mucus.  No dypnea, no nausea or vomiting, abdominal pain today=.  No falls or trauma.    Hand swelling for weeks. Leg swelling started today.  Tingling in right foot, both swollen.      She had received Solu-Medrol, Toradol, prednisone, gabapentin, ibuprofen.  They made a plan for bilateral upper extremity nerve conduction and EMG, and labs were ordered to evaluate for neuropathy including zinc, vitamin B1, methylmalonic acid, copper, ANCA screen, TSH, hemoglobin A1c, vitamin B12, protein electrophoresis, heavy metals profile  Past Medical History:  Diagnosis Date   Colitis      Home Medications Prior to Admission medications   Medication Sig Start Date End Date Taking? Authorizing Provider  amitriptyline (ELAVIL) 75 MG tablet Take 2 tablets (150 mg total) by mouth at bedtime as needed for sleep. 02/18/23   Christen Butter, NP  dicyclomine (BENTYL) 20 MG tablet Take 1 tablet (20 mg total) by mouth 3 (three) times daily as needed for spasms. 02/18/23   Christen Butter, NP  gabapentin  (NEURONTIN) 300 MG capsule One tab PO qHS for a week, then BID for a week, then TID. May double weekly to a max of 3,600mg /day 02/27/23   Monica Becton, MD  ibuprofen (ADVIL) 800 MG tablet Take 1 tablet (800 mg total) by mouth every 8 (eight) hours as needed. 02/28/23   Monica Becton, MD  omeprazole (PRILOSEC) 40 MG capsule Take 40 mg by mouth daily.    [provider]  ondansetron (ZOFRAN-ODT) 8 MG disintegrating tablet Take 1 tablet (8 mg total) by mouth every 8 (eight) hours as needed for nausea. 02/18/23   Christen Butter, NP      Allergies    Carafate [sucralfate] and Morphine and codeine    Review of Systems   Review of Systems  Physical Exam Updated Vital Signs Ht 5\' 7"  (1.702 m)   Wt 81.6 kg   LMP 02/16/2023   BMI 28.19 kg/m  Physical Exam  ED Results / Procedures / Treatments   Labs (all labs ordered are listed, but only abnormal results are displayed) Labs Reviewed - No data to display  EKG None  Radiology No results found.  Procedures Procedures  {Document cardiac monitor, telemetry assessment procedure when appropriate:1}  Medications Ordered in ED Medications - No data to display  ED Course/ Medical Decision Making/ A&P   {   Click here for ABCD2, HEART and other calculatorsREFRESH Note before signing :1}  Medical Decision Making Amount and/or Complexity of Data Reviewed Labs: ordered. Radiology: ordered.   ***  {Document critical care time when appropriate:1} {Document review of labs and clinical decision tools ie heart score, Chads2Vasc2 etc:1}  {Document your independent review of radiology images, and any outside records:1} {Document your discussion with family members, caretakers, and with consultants:1} {Document social determinants of health affecting pt's care:1} {Document your decision making why or why not admission, treatments were needed:1} Final Clinical Impression(s) / ED Diagnoses Final  diagnoses:  None    Rx / DC Orders ED Discharge Orders     None

## 2023-03-02 NOTE — Progress Notes (Signed)
  Pt had MRI Cervical spine w/wo ordered from the ED. Pt came down for MRI with no usable IV access. Pt stated 2 other unseccessful attempts were made to gain IV access on her other arm. MRI exam was completed as without contrast only. Will bring pt back for contrast if radiologist report of without exam states it is needed/necessary. Ordering MD made aware via secure chat.

## 2023-03-02 NOTE — ED Triage Notes (Signed)
Pt states she woke with stabbing chest pain this morning midchest. She has had tingling in her hands for weeks and has been seeking medical care. This morning swelling noted in hands and feet/lower legs.

## 2023-03-02 NOTE — ED Provider Notes (Signed)
Patient handed off to me awaiting DVT study of her arms and legs and MRI of the neck.  She has been dealing with some chronic numbness and tingling and weakness in her hands now for a long time.  She has been getting extensive outpatient workup with her primary care doctor and has a referral to neurology for further workup.  She has had unremarkable workup today from a cardiac and pulmonary standpoint.  DVT studies of her arms and legs were normal.  She had MRI of the cervical spine which was also normal.  She does not have any major gross swelling in her arms.  Suspect this could be a peripheral nerve issue.  Will put her on steroids and give her Roxicodone for breakthrough pain.  She is not having any lower extremity weakness or numbness or tingling.  No low back pain.  I have no concern for other spine issue or brain issue.  She will follow-up with her primary care doctor.  I have referred her to neurology.  Discharged in good condition.  This chart was dictated using voice recognition software.  Despite best efforts to proofread,  errors can occur which can change the documentation meaning.    Virgina Norfolk, DO 03/02/23 2054

## 2023-03-02 NOTE — Progress Notes (Signed)
Lower extremity venous bilateral and upper extremity venous bilateral study completed.  Preliminary results relayed to [employer] [facility], DO.    See CV Proc for preliminary results report.   Jean Rosenthal, RDMS, RVT

## 2023-03-02 NOTE — Discharge Instructions (Signed)
Take next dose of steroid tomorrow.  Take Roxicodone for breakthrough pain.  Overall I suspect that neurology will be the next person to evaluate you.  Lab work was unremarkable today.  Your MRI of your spine is normal.  Continue to follow-up with your primary care doctor.

## 2023-03-04 ENCOUNTER — Telehealth: Payer: Self-pay | Admitting: General Practice

## 2023-03-04 LAB — ZINC: Zinc: 66 ug/dL (ref 60–130)

## 2023-03-04 NOTE — Transitions of Care (Post Inpatient/ED Visit) (Signed)
   03/04/2023  Name: Cathy Lester MRN: [id number] DOB: 1980/10/29  Today's TOC FU Call Status: Today's TOC FU Call Status:: Successful TOC FU Call Competed TOC FU Call Complete Date: 03/04/23  Transition Care Management Follow-up Telephone Call Date of Discharge: 03/02/23 Discharge Facility: [facility]) Type of Discharge: Emergency Department Reason for ED Visit: Other: (Numbness and tingling) How have you been since you were released from the hospital?: Same Any questions or concerns?: No  Items Reviewed: Did you receive and understand the discharge instructions provided?: Yes Any new allergies since your discharge?: No Dietary orders reviewed?: NA Do you have support at home?: Yes  Medications Reviewed Today: Medications Reviewed Today     Reviewed by Modesto Charon, RN (Registered Nurse) on 03/04/23 at 1014  Med List Status: <None>   Medication Order Taking? Sig Documenting Provider Last Dose Status Informant  amitriptyline (ELAVIL) 75 MG tablet [id number] No Take 2 tablets (150 mg total) by mouth at bedtime as needed for sleep. Christen Butter, NP Taking Active   dicyclomine (BENTYL) 20 MG tablet [id number] No Take 1 tablet (20 mg total) by mouth 3 (three) times daily as needed for spasms. Christen Butter, NP Taking Active   gabapentin (NEURONTIN) 300 MG capsule [id number] No One tab PO qHS for a week, then BID for a week, then TID. May double weekly to a max of 3,600mg /day Monica Becton, MD Taking Active   ibuprofen (ADVIL) 800 MG tablet [id number]  Take 1 tablet (800 mg total) by mouth every 8 (eight) hours as needed. Monica Becton, MD  Active   methylPREDNISolone (MEDROL DOSEPAK) 4 MG TBPK tablet [id number]  Follow package insert Virgina Norfolk, DO  Active   omeprazole (PRILOSEC) 40 MG capsule [id number] No Take 40 mg by mouth daily. [provider] Taking Active   ondansetron (ZOFRAN-ODT) 8 MG disintegrating tablet [id number] No Take 1 tablet (8 mg total) by  mouth every 8 (eight) hours as needed for nausea. Christen Butter, NP Taking Active   oxyCODONE (ROXICODONE) 5 MG immediate release tablet [id number]  Take 1 tablet (5 mg total) by mouth every 6 (six) hours as needed for up to 10 doses for breakthrough pain. Virgina Norfolk, DO  Active             Home Care and Equipment/Supplies: Were Home Health Services Ordered?: NA Any new equipment or medical supplies ordered?: NA  Functional Questionnaire: Do you need assistance with bathing/showering or dressing?: No Do you need assistance with meal preparation?: No Do you need assistance with eating?: No Do you have difficulty maintaining continence: No Do you need assistance with getting out of bed/getting out of a chair/moving?: No Do you have difficulty managing or taking your medications?: No  Follow up appointments reviewed: PCP Follow-up appointment confirmed?: Yes Date of PCP follow-up appointment?: 03/11/23 Follow-up Provider: Christen Butter, NP Specialist Hospital Follow-up appointment confirmed?: No Reason Specialist Follow-Up Not Confirmed: Patient has Specialist Provider Number and will Call for Appointment Do you need transportation to your follow-up appointment?: No Do you understand care options if your condition(s) worsen?: Yes-patient verbalized understanding    SIGNATURE: Modesto Charon, RN BSN Nurse Health Advisor

## 2023-03-06 LAB — HEMOGLOBIN A1C
Hgb A1c MFr Bld: 5.4 %{Hb} (ref <5.7)
Mean Plasma Glucose: 108 mg/dL
eAG (mmol/L): 6 mmol/L

## 2023-03-06 LAB — ANCA SCREEN W REFLEX TITER: ANCA SCREEN: NEGATIVE

## 2023-03-06 LAB — METHYLMALONIC ACID AND HOMOCYSTEINE
Homocysteine: 9 umol/L (ref <10.4)
Methylmalonic Acid, Quant: 102 nmol/L (ref 87–318)

## 2023-03-06 LAB — COPPER, SERUM: Copper: 96 ug/dL (ref 70–175)

## 2023-03-06 LAB — VITAMIN B1: Vitamin B1 (Thiamine): 10 nmol/L (ref 8–30)

## 2023-03-06 LAB — VITAMIN B12: Vitamin B-12: 778 pg/mL (ref 200–1100)

## 2023-03-06 LAB — TSH: TSH: 2.05 m[IU]/L

## 2023-03-07 ENCOUNTER — Telehealth: Payer: Self-pay | Admitting: Medical-Surgical

## 2023-03-07 NOTE — Telephone Encounter (Signed)
Patient called wanting to get a doctors note for work for the dates of 6/19,6/20 and 6/23.   Patient still has not feeling in hands and is scheduled for an ER f/u on 03/11/23.

## 2023-03-08 NOTE — Telephone Encounter (Signed)
I will write the note, she can download it from MyChart, there is no need for her to have an ER follow-up with me, cancel that appointment, there is nothing else I can do until I see the nerve conduction study results.

## 2023-03-08 NOTE — Telephone Encounter (Signed)
Contacted patient to cancel appointment. She wanted an earlier appointment because she has to be at work at 4 PM.   She still can't feel her hands and the medication that was given to her at last visit with Dr. Benjamin Stain made her swell so she is not taking that medication.  She is scheduled for 06/24 at 9:50 AM.

## 2023-03-11 ENCOUNTER — Ambulatory Visit: Payer: 59 | Admitting: Medical-Surgical

## 2023-03-11 ENCOUNTER — Encounter: Payer: Self-pay | Admitting: Medical-Surgical

## 2023-03-11 ENCOUNTER — Ambulatory Visit (INDEPENDENT_AMBULATORY_CARE_PROVIDER_SITE_OTHER): Payer: 59 | Admitting: Medical-Surgical

## 2023-03-11 VITALS — BP 113/78 | HR 101 | Resp 20 | Ht 67.0 in | Wt 216.7 lb

## 2023-03-11 DIAGNOSIS — R200 Anesthesia of skin: Secondary | ICD-10-CM | POA: Diagnosis not present

## 2023-03-11 DIAGNOSIS — R202 Paresthesia of skin: Secondary | ICD-10-CM

## 2023-03-11 MED ORDER — PREGABALIN 50 MG PO CAPS: 50.0000 mg | ORAL_CAPSULE | Freq: Two times a day (BID) | ORAL | 1 refills | Status: DC

## 2023-03-11 NOTE — Progress Notes (Signed)
        Established patient visit  History, exam, impression, and plan:  1. Numbness and tingling in both hands Cathy Lester 42 year old female presenting today for follow-up on numbness and tingling in both hands.  She has been evaluated by myself as well as Dr. Benjamin Stain here in our office with comprehensive workup completed.  She did not tolerate gabapentin that was prescribed for her due to complaint of significant swelling.  She went to the emergency room to have her symptoms evaluated there with overall reassuring workup.  At the emergency room, she was prescribed oxycodone and sent home with 10 tablets which she has been taking as needed for pain.  Reports that she continues to have difficulty in using both her upper extremities and has numbness/tingling/pain that affects both hands and limits her ability to do daily activities.  She is worried about going back to work as she is unable to perform small tasks throughout the day such as fixing her hair and performing fine motor activities.  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  Reviewed previously drawn labs and results with no concerning findings.  It does not look like the heavy metals screening as well as the protein electrophoresis serum and urine were collected.  She has an appointment with neurology for nerve conduction study on July 8.  We did review her drug test being positive for [facility] which limits the safety of prescribing controlled substances.  At this point, I did discuss the possibility of trialing Lyrica.  Although this is controlled, feel that this might be a better fit than gabapentin since that was not tolerated.  Start Lyrica 50 mg twice daily and advised to take it regularly for several weeks before deciding that the medication was unhelpful.  Strongly recommend avoiding marijuana use while on this medication.  I will  extend her time away from work to get her through the appointment for the nerve conduction study so that we have more information about possible cause for her symptoms.  Work note provided with return date of 04/01/2023.  Advised FMLA and short-term disability forms should be faxed to our office for ASAP completion.  On review today, she did not want to have further lab work completed and would like to defer this.  If the nerve conduction study is unhelpful, we will reevaluate this.  Procedures performed this visit: None.  Return if symptoms worsen or fail to improve.  __________________________________ Thayer Ohm, DNP, APRN, FNP-BC Primary Care and Sports Medicine [facility] [place]

## 2023-03-25 ENCOUNTER — Ambulatory Visit (INDEPENDENT_AMBULATORY_CARE_PROVIDER_SITE_OTHER): Payer: 59 | Admitting: Neurology

## 2023-03-25 ENCOUNTER — Encounter: Payer: Self-pay | Admitting: Neurology

## 2023-03-25 DIAGNOSIS — G5603 Carpal tunnel syndrome, bilateral upper limbs: Secondary | ICD-10-CM

## 2023-03-25 DIAGNOSIS — R202 Paresthesia of skin: Secondary | ICD-10-CM | POA: Diagnosis not present

## 2023-03-25 NOTE — Procedures (Signed)
[facility]  [address] [place], [address]  [place], [place] [address] Tel: [phone] Fax: [phone] Test Date:  03/25/2023  Patient: Cathy Lester DOB: August 26, 1981 Physician: Jacquelyne Balint, MD  Sex: Female Height: 5\' 7"  Ref Phys: Rodney Langton, MD  ID#: [id number]   Technician:    History: This is a 42 year old female with numbness and tingling in bilateral hands.  NCV & EMG Findings: Extensive electrodiagnostic evaluation of the right and left upper limbs shows: Bilateral median sensory response is absent. Bilateral ulnar and radial sensory responses are within normal limits. Bilateral median (APB) motor responses show prolonged distal onset latency (L6.8, R6.4 ms) and reduced amplitude (L1.28, R2.0 mV). Bilateral ulnar (ADM) motor responses are within normal limits. Chronic motor axon loss changes without accompanying active denervation changes are seen in bilateral abductor pollicis brevis muscles. All other tested muscles are within normal limits with normal motor unit configuration and recruitment patterns.  Impression: This is an abnormal study. The findings are most consistent with the following: Evidence of bilateral median mononeuropathy at or distal to the wrist, consistent with carpal tunnel syndrome, severe in degree electrically. No electrodiagnostic evidence of a right or left cervical (C5-T1) motor radiculopathy. Screening studies for a right or left ulnar or radial mononeuropathy are normal.    ___________________________ Jacquelyne Balint, MD    Nerve Conduction Studies Motor Nerve Results    Latency Amplitude F-Lat Segment Distance CV Comment  Site (ms) Norm (mV) Norm (ms)  (cm) (m/s) Norm   Left Median (APB) Motor  Wrist *6.8  < 3.9 *1.28  > 6.0        Elbow 11.7 - 1.20 -  Elbow-Wrist 29 59  > 50   Right Median (APB) Motor  Wrist *6.4  < 3.9 *2.0  > 6.0        Elbow 11.9 - 1.94 -  Elbow-Wrist 27.5 50  > 50   Left Ulnar (ADM) Motor  Wrist 1.68   < 3.1 9.2  > 7.0        Bel elbow 5.5 - 8.9 -  Bel elbow-Wrist 22 58  > 50   Ab elbow 7.4 - 8.6 -  Ab elbow-Bel elbow 10 53 -   Right Ulnar (ADM) Motor  Wrist 1.88  < 3.1 10.9  > 7.0        Bel elbow 5.4 - 10.1 -  Bel elbow-Wrist 23 66  > 50   Ab elbow 7.0 - 10.1 -  Ab elbow-Bel elbow 10 63 -    Sensory Sites    Neg Peak Lat Amplitude (O-P) Segment Distance Velocity Comment  Site (ms) Norm (V) Norm  (cm) (ms)   Left Median Sensory  Wrist-Dig II *NR  < 3.4 *NR  > 20 Wrist-Dig II 13    Right Median Sensory  Wrist-Dig II *NR  < 3.4 *NR  > 20 Wrist-Dig II 13    Left Radial Sensory  Forearm-Wrist 2.0  < 2.7 30  > 18 Forearm-Wrist 10    Right Radial Sensory  Forearm-Wrist 2.3  < 2.7 26  > 18 Forearm-Wrist 10    Left Ulnar Sensory  Wrist-Dig V 2.8  < 3.1 18  > 12 Wrist-Dig V 11    Right Ulnar Sensory  Wrist-Dig V 2.7  < 3.1 17  > 12 Wrist-Dig V 11     Electromyography   Side Muscle Ins.Act Fibs Fasc Recrt Amp Dur Poly Activation Comment  Left FDI Nml Nml Nml Nml  Nml Nml Nml Nml N/A  Left EIP Nml Nml Nml Nml Nml Nml Nml Nml N/A  Left FPL Nml Nml Nml Nml Nml Nml Nml Nml N/A  Left APB Nml Nml Nml *2- *1+ *1+ *1+ Nml N/A  Left Pronator teres Nml Nml Nml Nml Nml Nml Nml Nml N/A  Left Biceps Nml Nml Nml Nml Nml Nml Nml Nml N/A  Left Triceps Nml Nml Nml Nml Nml Nml Nml Nml N/A  Left Deltoid Nml Nml Nml Nml Nml Nml Nml Nml N/A  Right FDI Nml Nml Nml Nml Nml Nml Nml Nml N/A  Right EIP Nml Nml Nml Nml Nml Nml Nml Nml N/A  Right APB Nml Nml Nml *SMU *1+ *1+ *2+ Nml N/A  Right Pronator teres Nml Nml Nml Nml Nml Nml Nml Nml N/A  Right Biceps Nml Nml Nml Nml Nml Nml Nml Nml N/A  Right Triceps Nml Nml Nml Nml Nml Nml Nml Nml N/A  Right Deltoid Nml Nml Nml Nml Nml Nml Nml Nml N/A      Waveforms:  Motor           Sensory

## 2023-03-28 ENCOUNTER — Other Ambulatory Visit (INDEPENDENT_AMBULATORY_CARE_PROVIDER_SITE_OTHER): Payer: 59

## 2023-03-28 ENCOUNTER — Ambulatory Visit (INDEPENDENT_AMBULATORY_CARE_PROVIDER_SITE_OTHER): Payer: 59 | Admitting: Sports Medicine

## 2023-03-28 DIAGNOSIS — G5603 Carpal tunnel syndrome, bilateral upper limbs: Secondary | ICD-10-CM | POA: Diagnosis not present

## 2023-03-28 NOTE — Progress Notes (Addendum)
    Procedures performed today:    Procedure: Real-time Ultrasound Guided hydrodissection of the left median nerve at the carpal tunnel Device: Samsung HS60 Verbal informed consent obtained.  Time-out conducted.  Noted no overlying erythema, induration, or other signs of local infection.  Skin prepped in a sterile fashion.  Local anesthesia: Topical Ethyl chloride.  With sterile technique and under real time ultrasound guidance: I noted an enlarged bifid median nerve. Using a 25-gauge needle advanced into the carpal tunnel, taking care to avoid intraneural injection I injected medication both superficial to and deep to the median nerve freeing it from surrounding structures, I then redirected the needle deep and injected further medication around the flexor tendons deep within the carpal tunnel for a total of 1 cc kenalog 40, 5 cc 1% lidocaine without epinephrine. Completed without difficulty  Advised to call if fevers/chills, erythema, induration, drainage, or persistent bleeding.  Images permanently stored and available for review in PACS.  Impression: Technically successful ultrasound guided median nerve hydrodissection.  Independent interpretation of notes and tests performed by another provider:   None.  Brief History, Exam, Impression, and Recommendations:    Bilateral carpal tunnel syndrome Please see prior history for details, ultimately nerve conduction and EMG showed severe bilateral carpal tunnel syndrome, patient is most symptomatic on the left, she also requests only unilateral injection today, today we did a median nerve hydrodissection with steroid injection, considering severity on nerve conduction and EMG I would like her to touch base urgently with hand surgery. We can do her right side next week if she desires.    ____________________________________________ Ihor Austin. Benjamin Stain, M.D., ABFM., CAQSM., AME. Primary Care and Sports Medicine [facility]  [facility]  Adjunct Professor of Family Medicine  [place] [employer] [facility] [employer] [facility] [employer]  Restaurant manager, fast food

## 2023-03-28 NOTE — Assessment & Plan Note (Signed)
Please see prior history for details, ultimately nerve conduction and EMG showed severe bilateral carpal tunnel syndrome, patient is most symptomatic on the left, she also requests only unilateral injection today, today we did a median nerve hydrodissection with steroid injection, considering severity on nerve conduction and EMG I would like her to touch base urgently with hand surgery. We can do her right side next week if she desires.

## 2023-04-03 ENCOUNTER — Ambulatory Visit (INDEPENDENT_AMBULATORY_CARE_PROVIDER_SITE_OTHER): Payer: 59 | Admitting: Sports Medicine

## 2023-04-03 ENCOUNTER — Other Ambulatory Visit (INDEPENDENT_AMBULATORY_CARE_PROVIDER_SITE_OTHER): Payer: 59

## 2023-04-03 DIAGNOSIS — G5603 Carpal tunnel syndrome, bilateral upper limbs: Secondary | ICD-10-CM | POA: Diagnosis not present

## 2023-04-03 NOTE — Progress Notes (Addendum)
    Procedures performed today:    Procedure: Real-time Ultrasound Guided hydrodissection of the right median nerve at the carpal tunnel Device: Samsung [id number] Verbal informed consent obtained.  Time-out conducted.  Noted no overlying erythema, induration, or other signs of local infection.  Skin prepped in a sterile fashion.  Local anesthesia: Topical Ethyl chloride.  With sterile technique and under real time ultrasound guidance: I noted an enlarged bifid median nerve. Using a 25-gauge needle advanced into the carpal tunnel, taking care to avoid intraneural injection I injected medication both superficial to and deep to the median nerve freeing it from surrounding structures, I then redirected the needle deep and injected further medication around the flexor tendons deep within the carpal tunnel for a total of 1 cc kenalog 40, 5 cc 1% lidocaine without epinephrine. Completed without difficulty  Advised to call if fevers/chills, erythema, induration, drainage, or persistent bleeding.  Images permanently stored and available for review in PACS.  Impression: Technically successful ultrasound guided median nerve hydrodissection.  Independent interpretation of notes and tests performed by another provider:   None.  Brief History, Exam, Impression, and Recommendations:    Bilateral carpal tunnel syndrome This 42 year old female did well after left-sided median nerve hydrodissection/steroid injection at the last visit, she would like me to do the right side today. She does have an appointment with hand surgery.  I filled out her FMLA paperwork today, continuous leave start date 03/11/2023 and end date 09/10/2023.  This will be faxed off.  I spent 30 minutes of total time managing this patient today, this includes chart review, face to face, and non-face to face time, this was separate from the time spent performing the above procedure..  ____________________________________________ Cathy Lester.  Benjamin Stain, M.D., ABFM., CAQSM., AME. Primary Care and Sports Medicine [place] [facility]  Adjunct Professor of Family Medicine  [place] [employer] [facility] [employer]  Restaurant manager, fast food

## 2023-04-03 NOTE — Assessment & Plan Note (Addendum)
This 42 year old female did well after left-sided median nerve hydrodissection/steroid injection at the last visit, she would like me to do the right side today. She does have an appointment with hand surgery.  I filled out her FMLA paperwork today, continuous leave start date 03/11/2023 and end date 09/10/2023.  This will be faxed off.

## 2023-04-08 ENCOUNTER — Telehealth: Payer: Self-pay | Admitting: Sports Medicine

## 2023-04-08 NOTE — Telephone Encounter (Signed)
If it is paperwork for her to fill out she needs to work with her HR manager, if there is a physician component then I am happy to fill it out an appointment.    Her diagnosis is "bilateral carpal tunnel syndrome ICD-10: G56.03"  She should already know the dates that she has been out of work.

## 2023-04-08 NOTE — Telephone Encounter (Signed)
Patient is requesting help with Workman's Comp paperwork she doesn't know dates and diagnoses that are required does she need an appointment ? Please advise

## 2023-04-09 NOTE — Telephone Encounter (Signed)
I can do that, but I cannot fill out any paperwork that I do not have.

## 2023-04-09 NOTE — Telephone Encounter (Signed)
Pt called. She stated that she is not talking about the [employer] paperwork, she got that taken care of.  She needs the [facility] paperwork filled out which is pertaining to the shot you gave her in her left hand. She stated that you told her you would send it in.

## 2023-04-10 ENCOUNTER — Telehealth: Payer: Self-pay | Admitting: Medical-Surgical

## 2023-04-10 NOTE — Telephone Encounter (Signed)
Error

## 2023-04-10 NOTE — Telephone Encounter (Signed)
Patient called and stated the FMLA paperwork was dropped off on 03/28/23. There is documentation of the FMLA paperwork was completed and sent out.  The pts job has not received the paperwork. Pt will bring new FMLA paperwork to be filled out.

## 2023-04-10 NOTE — Telephone Encounter (Signed)
Patient called back and stated her company only received 2 pages instead of 4 pages front and back Please resubmit FMLA paperwork

## 2023-04-17 ENCOUNTER — Telehealth: Payer: Self-pay | Admitting: Medical-Surgical

## 2023-04-17 NOTE — Telephone Encounter (Signed)
FMLA paperwork placed in Dr.T's box on 7/31 for completion.

## 2023-04-18 NOTE — Telephone Encounter (Signed)
I did it already on the 17th of last month.  I saw your note that it was incomplete, I did not fill out the reduced schedule or intermittent leave portions because she is taking continuous leave, that was filled out in its entirety.

## 2023-04-23 ENCOUNTER — Ambulatory Visit (INDEPENDENT_AMBULATORY_CARE_PROVIDER_SITE_OTHER): Payer: Self-pay | Admitting: Sports Medicine

## 2023-04-23 DIAGNOSIS — G5603 Carpal tunnel syndrome, bilateral upper limbs: Secondary | ICD-10-CM

## 2023-04-23 NOTE — Progress Notes (Signed)
    Procedures performed today:    None.  Independent interpretation of notes and tests performed by another provider:   None.  Brief History, Exam, Impression, and Recommendations:    Bilateral carpal tunnel syndrome Bilateral severe carpal tunnel syndrome, has responded insufficiently to bilateral carpal tunnel injections with ultrasound guidance, she has not responded to nocturnal splinting, she did have an appointment with Dr. Frazier Butt with hand surgery and would like a second opinion from another hand surgeon. I filled out additional paperwork today in the form of a written summary regarding her disability/FMLA/Worker's Comp.    ____________________________________________ Ihor Austin. Benjamin Stain, M.D., ABFM., CAQSM., AME. Primary Care and Sports Medicine [place] [facility]  Adjunct Professor of Family Medicine  [place] [employer] [facility] [employer]  Restaurant manager, fast food

## 2023-04-23 NOTE — Assessment & Plan Note (Signed)
Bilateral severe carpal tunnel syndrome, has responded insufficiently to bilateral carpal tunnel injections with ultrasound guidance, she has not responded to nocturnal splinting, she did have an appointment with Dr. Frazier Butt with hand surgery and would like a second opinion from another hand surgeon. I filled out additional paperwork today in the form of a written summary regarding her disability/FMLA/Worker's Comp.

## 2023-06-19 ENCOUNTER — Telehealth: Payer: Self-pay | Admitting: Medical-Surgical

## 2023-06-19 NOTE — Telephone Encounter (Signed)
Patient called in stating that her visits from 02/18/23 until now needs to charged via worker comp. She had provided a phone number for her bills and visits be sent to.  [phone] Case No. [id number]

## 2023-06-21 ENCOUNTER — Telehealth: Payer: Self-pay | Admitting: Medical-Surgical

## 2023-06-21 NOTE — Telephone Encounter (Signed)
Pt called. She was referred to [facility] but they did not accept her [employer].  She contacted claims dept and she was told than Cathy Lester will accept her workmans comp so she would like to be referred there.

## 2023-06-25 NOTE — Telephone Encounter (Signed)
Referral, clinical notes, imaging reports, demographics and copies of insurance cards have been faxed to [facility] at [phone]. Office will contact patient to schedule referral appointment.

## 2023-09-17 ENCOUNTER — Ambulatory Visit (INDEPENDENT_AMBULATORY_CARE_PROVIDER_SITE_OTHER): Payer: 59 | Admitting: Family Medicine

## 2023-09-17 ENCOUNTER — Encounter (HOSPITAL_BASED_OUTPATIENT_CLINIC_OR_DEPARTMENT_OTHER): Payer: Self-pay | Admitting: Family Medicine

## 2023-09-17 ENCOUNTER — Ambulatory Visit (INDEPENDENT_AMBULATORY_CARE_PROVIDER_SITE_OTHER): Payer: 59

## 2023-09-17 ENCOUNTER — Other Ambulatory Visit (HOSPITAL_BASED_OUTPATIENT_CLINIC_OR_DEPARTMENT_OTHER): Payer: Self-pay

## 2023-09-17 VITALS — BP 134/95 | HR 106 | Temp 98.0°F | Ht 67.0 in | Wt 241.0 lb

## 2023-09-17 DIAGNOSIS — R062 Wheezing: Secondary | ICD-10-CM | POA: Diagnosis not present

## 2023-09-17 DIAGNOSIS — R051 Acute cough: Secondary | ICD-10-CM

## 2023-09-17 LAB — POCT INFLUENZA A/B
Influenza A, POC: NEGATIVE
Influenza B, POC: NEGATIVE

## 2023-09-17 LAB — POC COVID19 BINAXNOW: SARS Coronavirus 2 Ag: NEGATIVE

## 2023-09-17 MED ORDER — ALBUTEROL SULFATE HFA 108 (90 BASE) MCG/ACT IN AERS
2.0000 | INHALATION_SPRAY | Freq: Four times a day (QID) | RESPIRATORY_TRACT | 2 refills | Status: AC | PRN
Start: 2023-09-17 — End: ?
  Filled 2023-09-17: qty 6.7, 25d supply, fill #0

## 2023-09-17 MED ORDER — AZITHROMYCIN 250 MG PO TABS
ORAL_TABLET | ORAL | 0 refills | Status: AC
Start: 2023-09-17 — End: 2023-09-22
  Filled 2023-09-17: qty 6, 5d supply, fill #0

## 2023-09-17 MED ORDER — PREDNISONE 20 MG PO TABS
20.0000 mg | ORAL_TABLET | Freq: Every day | ORAL | 0 refills | Status: AC
Start: 2023-09-17 — End: 2023-09-22
  Filled 2023-09-17: qty 5, 5d supply, fill #0

## 2023-09-17 MED ORDER — PROMETHAZINE-DM 6.25-15 MG/5ML PO SYRP
5.0000 mL | ORAL_SOLUTION | Freq: Four times a day (QID) | ORAL | 0 refills | Status: DC | PRN
Start: 2023-09-17 — End: 2023-11-25
  Filled 2023-09-17: qty 118, 6d supply, fill #0

## 2023-09-17 NOTE — Patient Instructions (Signed)

## 2023-09-17 NOTE — Progress Notes (Addendum)
 Acute Office Visit  Subjective:    Patient ID: Cathy Lester, female    DOB: 1981-06-21, 42 y.o.   MRN: [id number]  Chief Complaint  Patient presents with   Cough    Ongoing for about 5 days, can feel mucus in chest raspy cough, sore throat, SOB/Wheezing   Nasal Congestion    Yellow/brown mucus. Has tried Mucinex , NyQuil, Tylenol  Cold & Flu   Cathy Lester is a 42 year old who presents for an acute visit.  She reports she has had a cough for the past 5 days that is productive.  When her symptoms started she does report nausea vomiting and loss of appetite. She reports chest tightness and shortness of breath.  She reports she fluctuates between hot and cold chills, body aches, sinus pressure and headache. She reports her cough is worse at night and she often hears wheezing.   Sick contacts: unsure  Current smoker   ROS: see HPI    Objective:    BP (!) 134/95 Comment: Repeat BP  Pulse (!) 106 Comment: Repeat HR  Temp 98 F (36.7 C) (Oral)   Ht 5' 7 (1.702 m)   Wt 241 lb (109.3 kg)   SpO2 100%   BMI 37.75 kg/m   Physical Exam Vitals reviewed.  Constitutional:      Appearance: Normal appearance.  Cardiovascular:     Rate and Rhythm: Regular rhythm. Tachycardia present.     Pulses: Normal pulses.     Heart sounds: Normal heart sounds.  Pulmonary:     Effort: Pulmonary effort is normal. No tachypnea, prolonged expiration or respiratory distress.     Breath sounds: Examination of the right-upper field reveals decreased breath sounds and wheezing. Examination of the left-upper field reveals decreased breath sounds and wheezing. Decreased breath sounds and wheezing present.  Neurological:     Mental Status: She is alert.  Psychiatric:        Mood and Affect: Mood normal.        Behavior: Behavior normal.     Assessment & Plan:   1. Acute cough (Primary) Patient is a pleasant 42 year old female patient who presents today for concerns regarding acute cough. Patient is well  appearing and in no acute distress. Denies chest pain, palpitations, lower extremity edema, vision changes, headaches. POCT COVID and influenza testing was negative. Cardiovascular exam with heart regular rate and rhythm. Normal heart sounds, no murmurs present. Lungs with bilateral decreased breath sounds posteriorly and slight wheezing in the upper lungs. Harsh cough that is dry, non-productive. No frontal or maxillary sinus tenderness present to palpation. TMs intact with no erythema, drainage, or fluid present. Feel cough is most likely related to acute bronchitis. Will obtain CXR due to new onset wheezing. Will update patient with results and treat accordingly. Review of CXR shows no evidence of acute infection, such as pneumonia or pulmonary effusion. Advised patient to reach out if symptoms do not improve after antibiotic and steroid course.  - POC COVID-19 BinaxNow - POCT Influenza A/B - azithromycin  (ZITHROMAX ) 250 MG tablet; Take 2 tablets on day 1, then 1 tablet daily on days 2 through 5  Dispense: 6 tablet; Refill: 0 - predniSONE  (DELTASONE ) 20 MG tablet; Take 1 tablet (20 mg total) by mouth daily with breakfast for 5 days.  Dispense: 5 tablet; Refill: 0 - DG Chest 2 View; Future - promethazine -dextromethorphan (PROMETHAZINE -DM) 6.25-15 MG/5ML syrup; Take 5 mLs by mouth 4 (four) times daily as needed for cough (Maximum dose: 30mL in 24 hours).  Dispense: 118 mL; Refill: 0  2. Wheezing Due to wheezing, prescribed albuterol  inhaler. Advised to use every 4-6 hours as needed for wheezing or shortness of breath.  - albuterol  (VENTOLIN  HFA) 108 (90 Base) MCG/ACT inhaler; Inhale 2 puffs into the lungs every 6 (six) hours as needed for wheezing or shortness of breath.  Dispense: 6.7 g; Refill: 2  Return if symptoms worsen or fail to improve.  Evalene Arts, FNP

## 2023-09-24 ENCOUNTER — Other Ambulatory Visit (HOSPITAL_BASED_OUTPATIENT_CLINIC_OR_DEPARTMENT_OTHER): Payer: Self-pay | Admitting: Family Medicine

## 2023-09-24 DIAGNOSIS — R051 Acute cough: Secondary | ICD-10-CM

## 2023-09-26 ENCOUNTER — Other Ambulatory Visit (HOSPITAL_BASED_OUTPATIENT_CLINIC_OR_DEPARTMENT_OTHER): Payer: Self-pay

## 2023-10-07 ENCOUNTER — Other Ambulatory Visit (HOSPITAL_BASED_OUTPATIENT_CLINIC_OR_DEPARTMENT_OTHER): Payer: Self-pay

## 2023-10-07 ENCOUNTER — Encounter (HOSPITAL_BASED_OUTPATIENT_CLINIC_OR_DEPARTMENT_OTHER): Payer: Self-pay

## 2023-11-07 ENCOUNTER — Ambulatory Visit: Payer: Self-pay | Admitting: Sports Medicine

## 2023-11-07 ENCOUNTER — Encounter: Payer: Self-pay | Admitting: Sports Medicine

## 2023-11-07 DIAGNOSIS — R609 Edema, unspecified: Secondary | ICD-10-CM | POA: Insufficient documentation

## 2023-11-07 DIAGNOSIS — G5603 Carpal tunnel syndrome, bilateral upper limbs: Secondary | ICD-10-CM

## 2023-11-07 NOTE — Progress Notes (Signed)
    Procedures performed today:    None.  Independent interpretation of notes and tests performed by another provider:   None.  Brief History, Exam, Impression, and Recommendations:    Bilateral carpal tunnel syndrome This is a 43 year old female, she works for [employer], she has EMG and nerve conduction study confirmed bilateral carpal tunnel syndrome with absent median nerve sensory responses, results are available in the procedures tab in epic. She has tried multiple modalities of treatment including nocturnal splinting, we did bilateral median nerve hydrodissection with ultrasound guidance, she did have a left carpal tunnel release with an outside surgeon. The outside surgeon is no longer covered by her Worker's Comp. and so she needs a new referral. [facility] is in network per her report so we will set her up with Dr. Janee Morn with hand surgery, she also needs referral to their physical therapy department.  Swelling of hands and feet Unclear etiology, subjective swelling. Cathy Lester was taking ibuprofen, sounds to be Celebrex as well, I have asked her to stop both of these as they can cause some degree of peripheral swelling. She has had a serum rheumatoid workup that was negative. If no improvement over 2 weeks I would like her to follow-up with her PCP for this, I did inform her that extremity swelling was more of a medical process and would be better managed by her primary care provider.    ____________________________________________ Cathy Lester. Cathy Lester, M.D., ABFM., CAQSM., AME. Primary Care and Sports Medicine [place] [facility]  Adjunct Professor of Family Medicine  [place] [employer] [facility] [employer]  Restaurant manager, fast food

## 2023-11-07 NOTE — Assessment & Plan Note (Signed)
Unclear etiology, subjective swelling. [place] was taking ibuprofen, sounds to be Celebrex as well, I have asked her to stop both of these as they can cause some degree of peripheral swelling. She has had a serum rheumatoid workup that was negative. If no improvement over 2 weeks I would like her to follow-up with her PCP for this, I did inform her that extremity swelling was more of a medical process and would be better managed by her primary care provider.

## 2023-11-07 NOTE — Assessment & Plan Note (Addendum)
This is a 43 year old female, she works for [employer], she has EMG and nerve conduction study confirmed bilateral carpal tunnel syndrome with absent median nerve sensory responses, results are available in the procedures tab in epic. She has tried multiple modalities of treatment including nocturnal splinting, we did bilateral median nerve hydrodissection with ultrasound guidance, she did have a left carpal tunnel release with an outside surgeon. The outside surgeon is no longer covered by her Worker's Comp. and so she needs a new referral. [facility] is in network per her report so we will set her up with Dr. Janee Morn with hand surgery, she also needs referral to their physical therapy department.

## 2023-11-22 ENCOUNTER — Ambulatory Visit: Payer: Commercial Managed Care - PPO | Admitting: Medical-Surgical

## 2023-11-25 ENCOUNTER — Encounter: Payer: Self-pay | Admitting: Medical-Surgical

## 2023-11-25 ENCOUNTER — Ambulatory Visit (INDEPENDENT_AMBULATORY_CARE_PROVIDER_SITE_OTHER): Admitting: Medical-Surgical

## 2023-11-25 VITALS — BP 146/93 | HR 95 | Resp 20 | Ht 67.0 in | Wt 237.0 lb

## 2023-11-25 DIAGNOSIS — R609 Edema, unspecified: Secondary | ICD-10-CM | POA: Diagnosis not present

## 2023-11-25 MED ORDER — DICLOFENAC SODIUM 75 MG PO TBEC: 75.0000 mg | DELAYED_RELEASE_TABLET | Freq: Two times a day (BID) | ORAL | 0 refills | Status: AC

## 2023-11-25 NOTE — Progress Notes (Unsigned)
        Established patient visit  History, exam, impression, and plan:    Procedures performed this visit: None.  No follow-ups on file.  __________________________________ Thayer Ohm, DNP, APRN, FNP-BC Primary Care and Sports Medicine [facility]

## 2023-11-25 NOTE — Progress Notes (Unsigned)
 Subjective:  Patient ID: Cathy Lester, female    DOB: [birth year], 43 y.o.   MRN: [id number]  Patient Care Team: Christen Butter, NP as PCP - General (Nurse Practitioner)   Chief Complaint:  Medical Management of Chronic Issues (BILATERAL LEGS AND HANDS SWELLING)   HPI:  Cathy Lester is a 43 y.o. female presenting on 11/25/2023 for Medical Management of Chronic Issues (BILATERAL LEGS AND HANDS SWELLING)   History, Exam,  Impression and Plan  1. Swelling (Primary) Presents today anxious and concerned with chief complaint of swelling of hands and feet.  Patient is a scattered historian but clearly communicates being upset and concerned about results from left carpal tunnel syndrome surgery and reports that her fingers are still numb with occasional sharp electric pain to fingers of left hand. States that it is not any better than the hand that has not had surgery.  Patient is being worked up for carpal tunnel surgery to right hand.  Reports that she has stopped taking Lyrica "because it is related to Neurontin" and reports that it caused feet and hands to swell.  Also stopped Celebrex and ibuprofen because of swelling to hands and feet. Reports 8/10 pain without relief to whole body but mostly in hands.  On exam, has week grip bilaterally, Phalen's sign with negative grimace but positive grimace bilateral Tinel's sign with electric shooting pain to fingertips .  Negative carpal compression pain. Negative visible edema of hands and wrists bilateral.  Mild nonpitting ankle edema without pedal edema.  Per Patient she has follow-up appointment with surgeon 12/11/2023 for evaluation of left hand and evaluation of right hand for future surgery.  Will check electrolytes and inflammatory markers to rule out possible causes of pain.  Will check BNP for possible excessive circulating volume. Will draw hemoglobin. Last A1c was a 5.4 on 02/28/2023.  Will try a brief course of Voltaren gel to manage patient pain and  discomfort..  - CBC with Differential/Platelet - CMP14+EGFR - TSH - Hemoglobin A1c - B Nat Peptide - Sed Rate (ESR) - C-reactive protein -   diclofenac (VOLTAREN) 75 MG EC tablet; Take 1 tablet (75 mg total) by mouth 2 (two) times daily.  Continue all other maintenance medications.  Follow up plan: Return if symptoms worsen or fail to improve.  Patient to try Voltaren gel and return to clinic if no reduction in pain and discomfort. Pt seems receptive to possible future referral to pain management clinic. Will consider future nerve conduction testing to evaluate nonsurgical right vs surgical left hand.   Relevant past medical, surgical, family, and social history reviewed and updated as indicated.  Allergies and medications reviewed and updated. Data reviewed: Chart in Epic.   Past Medical History:  Diagnosis Date   Colitis     No past surgical history on file.  Social History   Socioeconomic History   Marital status: Single    Spouse name: Not on file   Number of children: Not on file   Years of education: Not on file   Highest education level: Associate degree: academic program  Occupational History   Not on file  Tobacco Use   Smoking status: Former    Current packs/day: 0.00    Types: Cigarettes    Quit date: 11/16/2022    Years since quitting: 1.0   Smokeless tobacco: Never  Vaping Use   Vaping status: Never Used  Substance and Sexual Activity   Alcohol use: No    Alcohol/week: 0.0  standard drinks of alcohol   Drug use: No   Sexual activity: Yes    Partners: Female  Other Topics Concern   Not on file  Social History Narrative   Not on file   Social Drivers of Health   Financial Resource Strain: Medium Risk (09/16/2023)   Overall Financial Resource Strain (CARDIA)    Difficulty of Paying Living Expenses: Somewhat hard  Food Insecurity: Food Insecurity Present (09/16/2023)   Hunger Vital Sign    Worried About Running Out of Food in the Last Year:  Sometimes true    Ran Out of Food in the Last Year: Sometimes true  Transportation Needs: No Transportation Needs (09/16/2023)   PRAPARE - Administrator, Civil Service (Medical): No    Lack of Transportation (Non-Medical): No  Physical Activity: Insufficiently Active (09/16/2023)   Exercise Vital Sign    Days of Exercise per Week: 7 days    Minutes of Exercise per Session: 20 min  Stress: Stress Concern Present (09/16/2023)   [employer] of Occupational Health - Occupational Stress Questionnaire    Feeling of Stress : Very much  Social Connections: Socially Isolated (09/16/2023)   Social Connection and Isolation Panel [NHANES]    Frequency of Communication with Friends and Family: Once a week    Frequency of Social Gatherings with Friends and Family: Once a week    Attends Religious Services: More than 4 times per year    Active Member of [employer] or Organizations: No    Attends Banker Meetings: Not on file    Marital Status: Never married  Intimate Partner Violence: Not on file    Outpatient Encounter Medications as of 11/25/2023  Medication Sig   albuterol (VENTOLIN HFA) 108 (90 Base) MCG/ACT inhaler Inhale 2 puffs into the lungs every 6 (six) hours as needed for wheezing or shortness of breath.   amitriptyline (ELAVIL) 75 MG tablet Take 2 tablets (150 mg total) by mouth at bedtime as needed for sleep.   diclofenac (VOLTAREN) 75 MG EC tablet Take 1 tablet (75 mg total) by mouth 2 (two) times daily.   dicyclomine (BENTYL) 20 MG tablet Take 1 tablet (20 mg total) by mouth 3 (three) times daily as needed for spasms.   omeprazole (PRILOSEC) 40 MG capsule Take 40 mg by mouth daily.   ondansetron (ZOFRAN-ODT) 8 MG disintegrating tablet Take 1 tablet (8 mg total) by mouth every 8 (eight) hours as needed for nausea.   oxyCODONE (ROXICODONE) 5 MG immediate release tablet Take 1 tablet (5 mg total) by mouth every 6 (six) hours as needed for up to 10 doses for  breakthrough pain.   [DISCONTINUED] pregabalin (LYRICA) 50 MG capsule Take 1 capsule (50 mg total) by mouth 2 (two) times daily.   [DISCONTINUED] promethazine-dextromethorphan (PROMETHAZINE-DM) 6.25-15 MG/5ML syrup Take 5 mLs by mouth 4 (four) times daily as needed for cough (Maximum dose: 30mL in 24 hours). (Patient not taking: Reported on 11/25/2023)   No facility-administered encounter medications on file as of 11/25/2023.    Allergies  Allergen Reactions   Gabapentin Swelling   Carafate [Sucralfate] Rash    Rash    Review of Systems      Objective:  BP (!) 146/93 (BP Location: Left Arm, Cuff Size: Normal)   Pulse 95   Resp 20   Ht 5\' 7"  (1.702 m)   Wt 237 lb (107.5 kg)   SpO2 99%   BMI 37.12 kg/m    Wt Readings from Last  3 Encounters:  11/25/23 237 lb (107.5 kg)  09/17/23 241 lb (109.3 kg)  03/11/23 216 lb 11.2 oz (98.3 kg)    Physical Exam  Results for orders placed or performed in visit on 09/17/23  POC COVID-19 BinaxNow   Collection Time: 09/17/23  9:00 AM  Result Value Ref Range   SARS Coronavirus 2 Ag Negative Negative  POCT Influenza A/B   Collection Time: 09/17/23  9:00 AM  Result Value Ref Range   Influenza A, POC Negative Negative   Influenza B, POC Negative Negative       Pertinent labs & imaging results that were available during my care of the patient were reviewed by me and considered in my medical decision making.   Continue healthy lifestyle choices, including diet (rich in fruits, vegetables, and lean proteins, and low in salt and simple carbohydrates) and exercise (at least 30 minutes of moderate physical activity daily).  The above assessment and management plan was discussed with the patient. The patient verbalized understanding of and has agreed to the management plan. Patient is aware to call the clinic if they develop any new symptoms or if symptoms persist or worsen. Patient is aware when to return to the clinic for a follow-up visit.  Patient educated on when it is appropriate to go to the emergency department.   Maryelizabeth Kaufmann Student AGNP

## 2023-11-26 ENCOUNTER — Encounter: Payer: Self-pay | Admitting: Medical-Surgical

## 2023-11-27 ENCOUNTER — Encounter: Payer: Self-pay | Admitting: Medical-Surgical

## 2023-11-27 ENCOUNTER — Telehealth: Payer: Self-pay | Admitting: Medical-Surgical

## 2023-11-27 LAB — CMP14+EGFR
ALT: 18 IU/L (ref 0–32)
AST: 22 IU/L (ref 0–40)
Albumin: 4.5 g/dL (ref 3.9–4.9)
Alkaline Phosphatase: 80 IU/L (ref 44–121)
BUN/Creatinine Ratio: 8 — ABNORMAL LOW (ref 9–23)
BUN: 5 mg/dL — ABNORMAL LOW (ref 6–24)
Bilirubin Total: 0.3 mg/dL (ref 0.0–1.2)
CO2: 22 mmol/L (ref 20–29)
Calcium: 9.5 mg/dL (ref 8.7–10.2)
Chloride: 103 mmol/L (ref 96–106)
Creatinine, Ser: 0.63 mg/dL (ref 0.57–1.00)
Globulin, Total: 2.8 g/dL (ref 1.5–4.5)
Glucose: 99 mg/dL (ref 70–99)
Potassium: 4.4 mmol/L (ref 3.5–5.2)
Sodium: 137 mmol/L (ref 134–144)
Total Protein: 7.3 g/dL (ref 6.0–8.5)
eGFR: 115 mL/min/{1.73_m2} (ref >59)

## 2023-11-27 LAB — SEDIMENTATION RATE: Sed Rate: 37 mm/h — ABNORMAL HIGH (ref 0–32)

## 2023-11-27 LAB — CBC WITH DIFFERENTIAL/PLATELET
Basophils Absolute: 0 10*3/uL (ref 0.0–0.2)
Basos: 0 %
EOS (ABSOLUTE): 0.1 10*3/uL (ref 0.0–0.4)
Eos: 2 %
Hematocrit: 38.4 % (ref 34.0–46.6)
Hemoglobin: 12.7 g/dL (ref 11.1–15.9)
Immature Grans (Abs): 0 10*3/uL (ref 0.0–0.1)
Immature Granulocytes: 0 %
Lymphocytes Absolute: 2.3 10*3/uL (ref 0.7–3.1)
Lymphs: 30 %
MCH: 28.2 pg (ref 26.6–33.0)
MCHC: 33.1 g/dL (ref 31.5–35.7)
MCV: 85 fL (ref 79–97)
Monocytes Absolute: 0.6 10*3/uL (ref 0.1–0.9)
Monocytes: 7 %
Neutrophils Absolute: 4.7 10*3/uL (ref 1.4–7.0)
Neutrophils: 61 %
Platelets: 339 10*3/uL (ref 150–450)
RBC: 4.5 x10E6/uL (ref 3.77–5.28)
RDW: 13.4 % (ref 11.7–15.4)
WBC: 7.7 10*3/uL (ref 3.4–10.8)

## 2023-11-27 LAB — C-REACTIVE PROTEIN: CRP: 3 mg/L (ref 0–10)

## 2023-11-27 LAB — TSH: TSH: 0.883 u[IU]/mL (ref 0.450–4.500)

## 2023-11-27 LAB — HEMOGLOBIN A1C
Est. average glucose Bld gHb Est-mCnc: 108 mg/dL
Hgb A1c MFr Bld: 5.4 % (ref 4.8–5.6)

## 2023-11-27 LAB — BRAIN NATRIURETIC PEPTIDE: BNP: 31.9 pg/mL (ref 0.0–100.0)

## 2023-11-27 NOTE — Telephone Encounter (Signed)
 11/27/2023-Left message on patients vmail, need worker comp Solicitor..tli

## 2023-12-04 NOTE — Telephone Encounter (Signed)
 Spoke with patient and relayed message regarding lab results.  She state she feels that this is in relation to gabapentin and pregabylin , and celebrex use  She states when she stopped these medications her symptoms improved.  She does not feel that it could be mental in origin.  She will speak to  her surgeon next week at upcoming appt  and she is currently undergoing hand therapy as well.  She will follow up with Joy in future if she finds that this is needed.

## 2024-03-13 ENCOUNTER — Encounter: Payer: Self-pay | Admitting: Podiatry

## 2024-03-13 ENCOUNTER — Ambulatory Visit (INDEPENDENT_AMBULATORY_CARE_PROVIDER_SITE_OTHER): Payer: Self-pay | Admitting: Podiatry

## 2024-03-13 ENCOUNTER — Telehealth: Payer: Self-pay | Admitting: *Deleted

## 2024-03-13 ENCOUNTER — Ambulatory Visit

## 2024-03-13 DIAGNOSIS — M79671 Pain in right foot: Secondary | ICD-10-CM

## 2024-03-13 DIAGNOSIS — D2371 Other benign neoplasm of skin of right lower limb, including hip: Secondary | ICD-10-CM | POA: Diagnosis not present

## 2024-03-13 DIAGNOSIS — M722 Plantar fascial fibromatosis: Secondary | ICD-10-CM

## 2024-03-13 NOTE — Progress Notes (Signed)
  Subjective:  Patient ID: Cathy Lester, female    DOB: [birth year],   MRN: [id number]  Chief Complaint  Patient presents with   Callouses    Under my right foot, I think there's a callus or something.    43 y.o. female presents for concern of right foot pain that has been ongoing for years . Believes a spot on the skin is causing the problem. She has tried trimming it but nothing has kepts it away. She has had it frozen off int he past as well . Denies any other pedal complaints. Denies n/v/f/c.   Past Medical History:  Diagnosis Date   Colitis     Objective:  Physical Exam: Vascular: DP/PT pulses 2/4 bilateral. CFT <3 seconds. Normal hair growth on digits. No edema.  Skin. No lacerations or abrasions bilateral feet. Hyperkeratotic cored lesion noted to platnar lateral right foot with disruption of skin lines.  Musculoskeletal: MMT 5/5 bilateral lower extremities in DF, PF, Inversion and Eversion. Deceased ROM in DF of ankle joint.  Neurological: Sensation intact to light touch.   Assessment:   1. Benign neoplasm of skin of foot, right      Plan:  Patient was evaluated and treated and all questions answered. -Discussed benign skin lesions with patient and treatment options.  -Hyperkeratotic tissue was debrided with chisel without incident.  -Applied salycylic acid treatment to area with dressing. Advised to remove bandaging tomorrow.  -Encouraged daily moisturizing -Discussed use of pumice stone -Advised good supportive shoes and inserts -Patient to return to office as needed or sooner if condition worsens.   Asberry Failing, DPM

## 2024-03-13 NOTE — Telephone Encounter (Signed)
 I called and asked the patient to arrive at 12:40 pm.  We need to get xrays of her foot.  I asked her to check in with us  then we will direct her to imaging for the xrays.

## 2024-05-19 ENCOUNTER — Encounter: Payer: Self-pay | Admitting: Sports Medicine

## 2024-07-20 ENCOUNTER — Ambulatory Visit: Payer: Self-pay

## 2024-07-20 NOTE — Telephone Encounter (Signed)
 FYI Only or Action Required?: FYI only for provider: appointment scheduled on 07/21/24.  Patient was last seen in primary care on 11/25/2023 by Cathy Mini, NP.  Called Nurse Triage reporting Headache, Insomnia, Blister, and Cough.  Symptoms began a week ago.  Interventions attempted: OTC medications: Theraflu.  Symptoms are: bilateral hands blisters, productive cough with yellow mucous, headache and difficulty sleeping with wheezing at night due to cough, chest congestion gradually worsening.  Triage Disposition: See Physician Within 24 Hours  Patient/caregiver understands and will follow disposition?: Yes          Copied from CRM [id number]. Topic: Clinical - Red Word Triage >> Jul 20, 2024  3:12 PM [name] wrote: Headache, chest congestion, sneezing, pain and blisters on both hands that she had surgery on back in approx Aug, lack of sleep. Contacted surgeon and he instructed her to go to pcp. Reason for Disposition  [1] Continuous (nonstop) coughing interferes with work or school AND [2] no improvement using cough treatment per Care Advice  Answer Assessment - Initial Assessment Questions 1. ONSET: When did the cough begin?      X 1 week.  2. SEVERITY: How bad is the cough today?      Cough causes headache and difficulty sleeping at night. Cough worse at night.  3. SPUTUM: Describe the color of your sputum (e.g., none, dry cough; clear, white, yellow, green)     Yellow.  4. HEMOPTYSIS: Are you coughing up any blood? If Yes, ask: How much? (e.g., flecks, streaks, tablespoons, etc.)     No.  5. DIFFICULTY BREATHING: Are you having difficulty breathing? If Yes, ask: How bad is it? (e.g., mild, moderate, severe)      Yes, somewhat. She states she has some wheezing at night during coughing fits.  6. FEVER: Do you have a fever? If Yes, ask: What is your temperature, how was it measured, and when did it start?     No.  7. CARDIAC HISTORY: Do you have any  history of heart disease? (e.g., heart attack, congestive heart failure)      No.  8. LUNG HISTORY: Do you have any history of lung disease?  (e.g., pulmonary embolus, asthma, emphysema)     No.  9. PE RISK FACTORS: Do you have a history of blood clots? (or: recent major surgery, recent prolonged travel, bedridden)     No.  10. OTHER SYMPTOMS: Do you have any other symptoms? (e.g., runny nose, wheezing, chest pain)       Blisters on bilateral hands (fingers and sides of hands below pinky). She states she took a pin and tried to pop the blisters with clear drainage noted. She states the blisters are smaller than a quarter and look white and crusty. Complains of chest congestion, numbness and tingling in hands (states that is chronic from carpel tunnel).  11. PREGNANCY: Is there any chance you are pregnant? When was your last menstrual period?       LMP 07/18/24.  12. TRAVEL: Have you traveled out of the country in the last month? (e.g., travel history, exposures)       No.  Protocols used: Cough - Acute Productive-A-AH

## 2024-07-21 ENCOUNTER — Ambulatory Visit: Admitting: Medical-Surgical

## 2024-07-21 ENCOUNTER — Other Ambulatory Visit: Payer: Self-pay | Admitting: Medical-Surgical

## 2024-07-21 ENCOUNTER — Encounter: Payer: Self-pay | Admitting: Medical-Surgical

## 2024-07-21 VITALS — BP 130/83 | HR 93 | Resp 20 | Ht 67.0 in | Wt 229.0 lb

## 2024-07-21 DIAGNOSIS — H66003 Acute suppurative otitis media without spontaneous rupture of ear drum, bilateral: Secondary | ICD-10-CM | POA: Diagnosis not present

## 2024-07-21 DIAGNOSIS — J069 Acute upper respiratory infection, unspecified: Secondary | ICD-10-CM

## 2024-07-21 DIAGNOSIS — G4700 Insomnia, unspecified: Secondary | ICD-10-CM

## 2024-07-21 DIAGNOSIS — L301 Dyshidrosis [pompholyx]: Secondary | ICD-10-CM | POA: Diagnosis not present

## 2024-07-21 LAB — POC COVID19/FLU A&B COMBO
Covid Antigen, POC: NEGATIVE
Influenza A Antigen, POC: NEGATIVE
Influenza B Antigen, POC: NEGATIVE

## 2024-07-21 LAB — POCT RAPID STREP A (OFFICE): Rapid Strep A Screen: NEGATIVE

## 2024-07-21 MED ORDER — AMOXICILLIN-POT CLAVULANATE 875-125 MG PO TABS: 1.0000 | ORAL_TABLET | Freq: Two times a day (BID) | ORAL | 0 refills | Status: AC

## 2024-07-21 MED ORDER — PROMETHAZINE-DM 6.25-15 MG/5ML PO SYRP: 5.0000 mL | ORAL_SOLUTION | Freq: Four times a day (QID) | ORAL | 0 refills | Status: AC | PRN

## 2024-07-21 MED ORDER — CLOBETASOL PROPIONATE 0.05 % EX OINT: 1.0000 | TOPICAL_OINTMENT | Freq: Two times a day (BID) | CUTANEOUS | 0 refills | Status: AC

## 2024-07-21 MED ORDER — AMITRIPTYLINE HCL 75 MG PO TABS
75.0000 mg | ORAL_TABLET | Freq: Every evening | ORAL | 3 refills | Status: AC | PRN
Start: 2024-07-21 — End: ?

## 2024-07-21 NOTE — Progress Notes (Signed)
 Insomnia: trial 1/5 tablets of Amitriptyline  at night to see if this is more helpful for sleep/pain/mood without excessive grogginess in the morning.   URI: flu, COVID, strep testing all negative.   Medical screening examination/treatment was performed by qualified clinical staff member and as supervising provider I was immediately available for consultation/collaboration. I have reviewed documentation and agree with assessment and plan.  Zada FREDRIK Palin, DNP, APRN, FNP-BC [facility]

## 2024-07-21 NOTE — Progress Notes (Signed)
 Acute Office Visit  Subjective:     Patient ID: Cathy Lester, female    DOB: [birth year], 43 y.o.   MRN: [id number]  Chief Complaint  Patient presents with   Cough   Nasal Congestion    Chest congestion - started last week   Headache   Insomnia    Difficulty sleeping from coughing   Blister    Blisters on bilateral hands and fingers that started last month   Sore Throat   Ear Pain    Bilateral    Cough Associated symptoms include ear pain, headaches and a sore throat. Pertinent negatives include no chills or fever.  Headache  Associated symptoms include coughing, ear pain, insomnia and a sore throat. Pertinent negatives include no fever.  Insomnia  Sore Throat  Associated symptoms include congestion, coughing, ear pain and headaches.   Patient is in today for nasal congestion, cough w/ yellow mucus, headache, sore throat, and bilateral ear pain. Sx started about a week ago. Denies any sick contacts. She has tried taking other the counter therapflu, nyquil, and mucinex  with minimal relief. She has also used her albuterol  inhaler with no relief. Denies fever or chills.   She also reports having blisters on both hands that are painful and itchy. She states that she started noticing them after she had her carpal tunnel relief surgery. Reports that she sometimes will stick a pin her blisters to drain them.  Reports having issues with drowsiness in the morning after taking the amitriptyline  at night. Would like to discuss changing the medication or decreasing the dosage.  Review of Systems  Constitutional:  Negative for chills and fever.  HENT:  Positive for congestion, ear pain and sore throat.   Eyes: Negative.   Respiratory:  Positive for cough and sputum production.   Cardiovascular: Negative.   Gastrointestinal: Negative.   Genitourinary: Negative.   Musculoskeletal: Negative.   Neurological:  Positive for headaches.  Endo/Heme/Allergies: Negative.    Psychiatric/Behavioral:  The patient has insomnia.         Objective:    BP 130/83 (BP Location: Right Arm, Cuff Size: Normal)   Pulse 93   Resp 20   Ht 5' 7 (1.702 m)   Wt 103.9 kg   SpO2 99%   BMI 35.87 kg/m  BP Readings from Last 3 Encounters:  07/21/24 130/83  11/25/23 (!) 146/93  09/17/23 (!) 134/95      Physical Exam Vitals and nursing note reviewed.  Constitutional:      General: She is not in acute distress.    Appearance: She is well-developed.  HENT:     Head: Normocephalic.     Right Ear: Ear canal normal. Tenderness present. No drainage. A middle ear effusion is present.     Left Ear: Ear canal normal. Tenderness present. No drainage. A middle ear effusion is present.     Nose: Congestion present.     Mouth/Throat:     Mouth: Mucous membranes are moist.     Pharynx: Oropharynx is clear.  Eyes:     Extraocular Movements: Extraocular movements intact.     Conjunctiva/sclera: Conjunctivae normal.  Cardiovascular:     Rate and Rhythm: Normal rate and regular rhythm.     Heart sounds: Normal heart sounds.  Pulmonary:     Effort: Pulmonary effort is normal.     Breath sounds: Normal breath sounds.  Musculoskeletal:     Cervical back: Normal range of motion.  Skin:    General: Skin  is warm and dry.     Findings: Rash present. Rash is crusting.     Comments: Blisters on palm of both hands in various stages of healing  Neurological:     General: No focal deficit present.     Mental Status: She is alert and oriented to person, place, and time.  Psychiatric:        Mood and Affect: Mood normal.        Behavior: Behavior normal.        Thought Content: Thought content normal.        Judgment: Judgment normal.     Results for orders placed or performed in visit on 07/21/24  POC Covid19/Flu A&B Antigen  Result Value Ref Range   Influenza A Antigen, POC Negative Negative   Influenza B Antigen, POC Negative Negative   Covid Antigen, POC Negative  Negative  POCT rapid strep A  Result Value Ref Range   Rapid Strep A Screen Negative Negative        Assessment & Plan:   1. Viral URI with cough (Primary) -Recommended cool midst humidifier, tylenol , hydration, and rest. -Will send promethazine  cough syrup for cough - POC Covid19/Flu A&B Antigen - POCT rapid strep A - promethazine -dextromethorphan (PROMETHAZINE -DM) 6.25-15 MG/5ML syrup; Take 5 mLs by mouth 4 (four) times daily as needed.  Dispense: 118 mL; Refill: 0  2. Non-recurrent acute suppurative otitis media of both ears without spontaneous rupture of tympanic membranes -Take tylenol  as needed for pain - amoxicillin -clavulanate (AUGMENTIN) 875-125 MG tablet; Take 1 tablet by mouth 2 (two) times daily.  Dispense: 14 tablet; Refill: 0  3. Dyshidrotic eczema -Apply clobetasol ointment two times daily for two weeks and take a break - clobetasol ointment (TEMOVATE) 0.05 %; Apply 1 Application topically 2 (two) times daily.  Dispense: 30 g; Refill: 0   4. Insomnia, unspecified type -Will send amitriptyline  75 mg for patient to trial taking 1.5 to 2 mg tablets nightly for insomnia   Return if symptoms worsen or fail to improve.   Derrek JINNY Freund, NP Student

## 2024-07-22 ENCOUNTER — Telehealth: Payer: Self-pay

## 2024-07-22 NOTE — Telephone Encounter (Signed)
 Copied from CRM #[id number]. Topic: Clinical - Prescription Issue >> Jul 22, 2024 10:59 AM Treva T wrote: Reason for CRM: Patient calling to check status of medication that was discussed t appointment, states medication was not sent to pharmacy.  Medication:  amitriptyline  75 mg  Per chart review, medication status:  E-Prescribing Status: Receipt confirmed by pharmacy (07/21/2024  8:07 PM EST)   Sent to:  [id number] [address], [place] - [address] AT [address], [place] [address] Phone: [phone]  Fax: [phone]   Patient informed of above, verbalized understanding, however patient states per pharmacy informed patient above prescription was denied.   Patient is requesting a follow up call, can be reached at [phone].

## 2024-07-22 NOTE — Telephone Encounter (Signed)
 Spoke with patient pharamcy Prescription was received but the pharmacy did not have enough  of this medication to fill the full amount.  They did fill a qty of #10 and will fill remainder of prescription when med comes in on their truck tomorrow.   Patient informed as above.

## 2024-07-30 ENCOUNTER — Telehealth: Payer: Self-pay

## 2024-07-30 NOTE — Telephone Encounter (Signed)
 Left message for a return call. May need a mammogram and PAP.
